# Patient Record
Sex: Female | Born: 1975 | Race: Black or African American | Hispanic: No | Marital: Single | Smoking: Current every day smoker
Health system: Southern US, Community
[De-identification: ages and names within clinical notes are randomized; demographics above are authoritative.]

## PROBLEM LIST (undated history)

## (undated) DIAGNOSIS — O009 Unspecified ectopic pregnancy without intrauterine pregnancy: Secondary | ICD-10-CM

## (undated) DIAGNOSIS — M1990 Unspecified osteoarthritis, unspecified site: Secondary | ICD-10-CM

## (undated) DIAGNOSIS — M069 Rheumatoid arthritis, unspecified: Secondary | ICD-10-CM

## (undated) HISTORY — DX: Rheumatoid arthritis, unspecified: M06.9

---

## 1997-07-29 ENCOUNTER — Inpatient Hospital Stay (HOSPITAL_COMMUNITY): Admission: AD | Admit: 1997-07-29 | Discharge: 1997-07-29 | Payer: Self-pay | Admitting: *Deleted

## 2000-08-30 ENCOUNTER — Emergency Department (HOSPITAL_COMMUNITY): Admission: EM | Admit: 2000-08-30 | Discharge: 2000-08-30 | Payer: Self-pay | Admitting: Emergency Medicine

## 2000-09-01 ENCOUNTER — Encounter (INDEPENDENT_AMBULATORY_CARE_PROVIDER_SITE_OTHER): Payer: Self-pay

## 2000-09-01 ENCOUNTER — Observation Stay (HOSPITAL_COMMUNITY): Admission: AD | Admit: 2000-09-01 | Discharge: 2000-09-02 | Payer: Self-pay | Admitting: Obstetrics

## 2000-09-01 ENCOUNTER — Encounter: Payer: Self-pay | Admitting: Obstetrics

## 2000-09-07 ENCOUNTER — Other Ambulatory Visit: Admission: RE | Admit: 2000-09-07 | Discharge: 2000-09-07 | Payer: Self-pay | Admitting: *Deleted

## 2001-09-08 ENCOUNTER — Inpatient Hospital Stay (HOSPITAL_COMMUNITY): Admission: AD | Admit: 2001-09-08 | Discharge: 2001-09-08 | Payer: Self-pay | Admitting: *Deleted

## 2001-09-30 ENCOUNTER — Inpatient Hospital Stay (HOSPITAL_COMMUNITY): Admission: AD | Admit: 2001-09-30 | Discharge: 2001-09-30 | Payer: Self-pay | Admitting: Obstetrics and Gynecology

## 2002-01-16 ENCOUNTER — Encounter: Payer: Self-pay | Admitting: Obstetrics & Gynecology

## 2002-01-16 ENCOUNTER — Inpatient Hospital Stay (HOSPITAL_COMMUNITY): Admission: AD | Admit: 2002-01-16 | Discharge: 2002-01-16 | Payer: Self-pay | Admitting: Obstetrics & Gynecology

## 2002-06-21 ENCOUNTER — Other Ambulatory Visit: Admission: RE | Admit: 2002-06-21 | Discharge: 2002-06-21 | Payer: Self-pay | Admitting: Obstetrics & Gynecology

## 2002-09-19 ENCOUNTER — Inpatient Hospital Stay (HOSPITAL_COMMUNITY): Admission: AD | Admit: 2002-09-19 | Discharge: 2002-09-22 | Payer: Self-pay | Admitting: Obstetrics & Gynecology

## 2007-11-22 ENCOUNTER — Inpatient Hospital Stay (HOSPITAL_COMMUNITY): Admission: AD | Admit: 2007-11-22 | Discharge: 2007-11-22 | Payer: Self-pay | Admitting: Family Medicine

## 2008-02-20 ENCOUNTER — Emergency Department (HOSPITAL_COMMUNITY): Admission: EM | Admit: 2008-02-20 | Discharge: 2008-02-20 | Payer: Self-pay | Admitting: Emergency Medicine

## 2009-09-17 ENCOUNTER — Inpatient Hospital Stay (HOSPITAL_COMMUNITY): Admission: AD | Admit: 2009-09-17 | Discharge: 2009-09-17 | Payer: Self-pay | Admitting: Obstetrics & Gynecology

## 2010-05-07 ENCOUNTER — Emergency Department (HOSPITAL_COMMUNITY)
Admission: EM | Admit: 2010-05-07 | Discharge: 2010-05-08 | Payer: Self-pay | Source: Home / Self Care | Admitting: Emergency Medicine

## 2010-05-07 LAB — CBC
MCH: 30.5 pg (ref 26.0–34.0)
MCHC: 33.8 g/dL (ref 30.0–36.0)
Platelets: 258 10*3/uL (ref 150–400)
RDW: 13.1 % (ref 11.5–15.5)

## 2010-05-07 LAB — DIFFERENTIAL
Basophils Absolute: 0 10*3/uL (ref 0.0–0.1)
Basophils Relative: 0 % (ref 0–1)
Eosinophils Absolute: 0.1 10*3/uL (ref 0.0–0.7)
Monocytes Absolute: 0.5 10*3/uL (ref 0.1–1.0)
Monocytes Relative: 8 % (ref 3–12)

## 2010-05-07 LAB — URINALYSIS, ROUTINE W REFLEX MICROSCOPIC
Bilirubin Urine: NEGATIVE
Ketones, ur: NEGATIVE mg/dL
Leukocytes, UA: NEGATIVE
Nitrite: NEGATIVE
Protein, ur: NEGATIVE mg/dL
Specific Gravity, Urine: 1.025 (ref 1.005–1.030)
Urine Glucose, Fasting: NEGATIVE mg/dL
Urobilinogen, UA: 1 mg/dL (ref 0.0–1.0)
pH: 7 (ref 5.0–8.0)

## 2010-05-07 LAB — WET PREP, GENITAL
Trich, Wet Prep: NONE SEEN
Yeast Wet Prep HPF POC: NONE SEEN

## 2010-05-07 LAB — POCT PREGNANCY, URINE: Preg Test, Ur: NEGATIVE

## 2010-05-07 LAB — URINE MICROSCOPIC-ADD ON

## 2010-05-08 LAB — COMPREHENSIVE METABOLIC PANEL
AST: 13 U/L (ref 0–37)
Albumin: 3.9 g/dL (ref 3.5–5.2)
Calcium: 9.6 mg/dL (ref 8.4–10.5)
Creatinine, Ser: 0.66 mg/dL (ref 0.4–1.2)
GFR calc Af Amer: >60 mL/min (ref >60)
GFR calc non Af Amer: >60 mL/min (ref >60)
Sodium: 138 mEq/L (ref 135–145)
Total Protein: 6.5 g/dL (ref 6.0–8.3)

## 2010-05-14 ENCOUNTER — Encounter: Payer: Self-pay | Admitting: Emergency Medicine

## 2010-06-23 ENCOUNTER — Emergency Department (HOSPITAL_COMMUNITY)
Admission: EM | Admit: 2010-06-23 | Discharge: 2010-06-23 | Disposition: A | Discharge status: Home / Self Care | Payer: Self-pay | Attending: Emergency Medicine | Admitting: Emergency Medicine

## 2010-06-23 DIAGNOSIS — R51 Headache: Secondary | ICD-10-CM | POA: Insufficient documentation

## 2010-06-23 DIAGNOSIS — IMO0002 Reserved for concepts with insufficient information to code with codable children: Secondary | ICD-10-CM | POA: Insufficient documentation

## 2010-06-23 DIAGNOSIS — S01501A Unspecified open wound of lip, initial encounter: Secondary | ICD-10-CM | POA: Insufficient documentation

## 2010-06-29 ENCOUNTER — Emergency Department (HOSPITAL_COMMUNITY)
Admission: EM | Admit: 2010-06-29 | Discharge: 2010-06-29 | Disposition: A | Discharge status: Home / Self Care | Payer: Self-pay | Attending: Emergency Medicine | Admitting: Emergency Medicine

## 2010-06-29 DIAGNOSIS — Z4802 Encounter for removal of sutures: Secondary | ICD-10-CM | POA: Insufficient documentation

## 2010-06-30 LAB — URINALYSIS, ROUTINE W REFLEX MICROSCOPIC
Bilirubin Urine: NEGATIVE
Leukocytes, UA: NEGATIVE
Nitrite: NEGATIVE
Specific Gravity, Urine: >1.03 — ABNORMAL HIGH (ref 1.005–1.030)
Urobilinogen, UA: 0.2 mg/dL (ref 0.0–1.0)

## 2010-06-30 LAB — CBC
HCT: 35.2 % — ABNORMAL LOW (ref 36.0–46.0)
Platelets: 294 10*3/uL (ref 150–400)
WBC: 5.8 10*3/uL (ref 4.0–10.5)

## 2010-06-30 LAB — POCT PREGNANCY, URINE: Preg Test, Ur: NEGATIVE

## 2010-06-30 LAB — WET PREP, GENITAL: Yeast Wet Prep HPF POC: NONE SEEN

## 2010-06-30 LAB — GC/CHLAMYDIA PROBE AMP, GENITAL
Chlamydia, DNA Probe: NEGATIVE
GC Probe Amp, Genital: NEGATIVE

## 2010-06-30 LAB — URINE MICROSCOPIC-ADD ON

## 2010-08-29 NOTE — Op Note (Signed)
[facility]  Patient:    Alison Lowery, Alison Lowery                        MRN: [id number] Proc. Date: 09/01/00 Adm. Date:  [id number] Attending:  Tammi Sou CC:         [facility], attn. Dr. Earline Mayotte                           Operative Report  PREOPERATIVE DIAGNOSIS:       Rule out left ectopic pregnancy.  POSTOPERATIVE DIAGNOSIS:      Left ampullary ectopic pregnancy, hemoperitoneum.  OPERATION:                    Diagnostic laparoscopy, left salpingectomy, and evacuation of hemoperitoneum.  SURGEON:                      Sung Amabile. Roslyn Smiling, M.D.  ASSISTANT:  ANESTHESIA:                   General anesthesia with endotracheal tube intubation.  ESTIMATED BLOOD LOSS:         200 cc.  TUBES AND DRAINS:             Foley intraoperatively.  COMPLICATIONS:                None.  FINDINGS:                     Anteverted, top normal size uterus with small subserosal fibroid in the posterior fundal area.  Left tube with ampullary pregnancy significantly distorted and distended, but unruptured. Approximately 150 cc of blood in the pelvis.  Normal left ovary.  Normal right tube and ovary, upper abdomen, and pelvis.  Appendix not visualized and felt to be retrocecal.  SPECIMENS:                    Left tube and tubal contents to pathology.  INDICATIONS:                  A 35 year old woman, gravida 3, para 1-0-1-1, last menstrual period July 15, 2000, who presented to the emergency room today because of significant left lower quadrant pain.  The patient is known to be pregnant.  She was evaluated in the [facility] ER on Aug 30, 2000, where a quantitative hCG was 267.  She was felt to have a threatened abortion, but advised to return to the hospital if no improvement.  She has had no dizziness or shoulder pain and quantitative hCG on May 22 was 264. Ultrasound reveals left-side adnexal mass and a moderate amount of free fluid in  the pelvis.  She is taken to the operating room now to rule out ectopic pregnancy.  DESCRIPTION OF PROCEDURE:     After the establishment of general anesthesia, the patient was placed in the dorsal lithotomy position.  The abdomen and perineum, and vagina were prepped with Betadine solution.  Foley catheter was placed.  Graves speculum was inserted in the vagina.  The cervix was prepped again with Betadine solution and the anterior cervical lip was grasped with a single tooth tenaculum.  Hulka tenaculum was passed into the uterus and attached to the cervix.  The speculum was removed and the patient was draped. A transverse subumbilical incision was made on  the skin with the knife. Veress needle was passed through this incision into the abdominal cavity while the lower abdominal wall was elevated.  Placement in the abdominal cavity was confirmed as the pressure dropped appropriately.  CO2 was insufflated.  After adequate pneumoperitoneum was instilled, Veress needle was removed, and laparoscopic sleeve and trocar were passed easily into the interabdominal cavity.  Scope was inserted and placement was ascertained.  Two other ports, one in the midline suprapubically and in the left lower quadrant were placed under direct visualization.  There were 5 mm ports.  The above findings were noted.  Because of the damage, distention, and distortion of the left tube, the decision was made to proceed with salpingectomy rather than salpingostomy.  The tube was grasped.  Tripolar cautery was used to cauterized and then transect the tube near the cornual area and then along the mesosalpinx.  The tube with its ectopic contents was then freed.  5 mm scope was placed in the left lower quadrant port and an endocatch bag was passed through the subumbilical port.  The tube and its contents were placed in the sac and then removed through the subumbilical port.  10/11 sleeve and trocar were reintroduced through  the subumbilical port.  The larger laparoscope was then reattached.  The pelvis was irrigated with normal saline through Nezhat irrigator.  Small bleeding areas along the serosa were cauterized with tripolar cautery.  Hemostasis was noted.  The upper abdomen was evaluated and noted to be normal.  The pelvis was liberally irrigated with warm normal saline.  The pneumoperitoneum was decreased and no bleeding from the surgical site was noted.  The lower sleeves were removed under direct visualization.  The remainder of the pneumoperitoneum was removed and the subumbilical port was then removed.  Marcaine (8 cc) was used to infiltrate the incisional areas.  Subumbilical incision fascia was closed with figure-of-eight suture of 0 Vicryl.  The fat was closed with single suture of 0 Vicryl.  All skin incisions were closed with mattress sutures of 4-0 Vicryl. Hemostasis was noted.  Steri-Strips were applied to all of the incisions. Pressure dressing was placed over the umbilical incision.  Vaginal instruments were removed and Foley catheter was removed.  The patient was returned to the supine position, extubated without difficulty, and transported to the recovery room in satisfactory condition. DD:  09/01/00 TD:  09/02/00 Job: [id number]

## 2010-08-29 NOTE — H&P (Signed)
NAME:  KENI, WAFER NO.:  [id number]   MEDICAL RECORD NO.:  [id number]                   PATIENT TYPE:  INP   LOCATION:  9169                                 FACILITY:  [facility]   PHYSICIAN:  Genia Del, M.D.             DATE OF BIRTH:  06-14-1975   DATE OF ADMISSION:  09/19/2002  DATE OF DISCHARGE:                                HISTORY & PHYSICAL   IDENTIFYING DATA:  Mrs.  Laguna is a 35 year old G5, P1, A3, expected date  of delivery September 15, 2002 at 40 weeks 4 days gestation.   REASON FOR ADMISSION:  Induction for postdates with suspicion of large for  gestational age and poor compliance to obstetrical care.   HISTORY OF PRESENT ILLNESS:  Fetal movements positive.  No regular uterine  contractions.  No vaginal bleeding.  No fluid leak.  No PIH symptoms.  The  patient did not come for return OB visits for the past four weeks. She came  today because she reached postdates and felt slightly decreased fetal  movements.  Vaginal exam reveals the cervix at 4+ cm dilated, vertex  presentation and membranes intact.   PAST OBSTETRICAL HISTORY:  In 1993 about nine weeks spontaneous abortion,  complete, no complications.  In 1994, 40-week spontaneous vaginal delivery,  baby boy, 7 pounds 7 ounces.  No complications.  May 2002 left ectopic  pregnancy and left salpingectomy at [facility].  No complications.  May  2003 seven week therapeutic abortion.  No complications.   PAST MEDICAL HISTORY:  Negative.   PAST SURGICAL HISTORY:  1. May 2002 ectopic pregnancy, left salpingectomy.  2. Therapeutic abortion in May 2003.   PAST GYNECOLOGICAL HISTORY:  Left salpingectomy in May 2002.   ALLERGIES:  No known drug allergies.   MEDICATIONS:  Prenatal vitamins.   SOCIAL HISTORY:  Single.  Lives with father of baby. Smoker.   HISTORY OF PRESENT PREGNANCY:  Late prenatal care. Nurse visit at [facility] at 22+ weeks, then a new Ob visit at 27+  weeks.  An ultrasound review  of anatomy was done at that time, which was within normal limits, dating  corresponded.  Placenta was anterior, normal.  Amniotic fluid was within  normal limits and the cervix was 3.8 cm and closed,  estimated fetal weight  was at the 58th percentile.  It was too late to do a triple test.  Her labs  at her first visit showed a hemoglobin at 10.8 and platelets 214,000.  Blood  type Rh, O positive.  Rh antibodies negative.  Sickle cell trait negative.  Thalassemia negative.  RPR nonreactive.  HBSAG negative.  HIV nonreactive.  Rubella titers immune.  One-hour GTT was within normal limits.  The patient  had a group B Strep done at 35+ weeks, which was negative.  Blood pressures  were normal each visit and uterine height corresponded well, but patient's  frequency; did not present for follow up.  She was not seen for the past  four weeks until her visit today on June 08th at 40 weeks 4 days.  An  ultrasound was done at the office, which showed an estimated fetal weight  just above 9 pounds with normal amniotic fluid and cephalic presentation.   REVIEW OF SYSTEMS:  CONSTITUTIONAL:  Negative.  HEENT:  Negative.  RESPIRATORY AND CARDIOVASCULAR:  Negative.  GASTROINTESTINAL AND UROLOGIC:  Negative.  ENDOCRINOLOGIC, NEUROLOGIC AND DERMATOLOGIC:  Negative.   PHYSICAL EXAMINATION:  GENERAL:  No apparent distress.  VITAL SIGNS:  Blood pressure 123/70, pulse 96 and regular, respiratory rate  20 and temperature 98.2.  LUNGS:  Clear.  HEART:  Regular cardiac rhythm.  ABDOMEN:  Gravid.  Cephalic presentation.  VAGINAL EXAMINATION:  Four plus centimeters dilated, 80%, vertex, -1 and  membranes intact.  EXTREMITIES:  Lower limbs normal.   Fetal monitoring:  Fetal heart rate 130s, variable with accelerations and no  decelerations.  Uterine contractions every six to eight minutes lasting 60  seconds to 80 seconds, mild.   IMPRESSION:  1. Gravida 5, para 1, abortus 3 at  40 weeks 4 days gestation with postdates     and suspicion of large for gestational age, and poor compliance to     obstetrical care.  2. Fetal well-being reassuring.  3. Group B Streptococcus negative.   PLAN:  1. Admit to labor and delivery.  2. Monitoring.  3. Induction with low-dose Pitocin and artificial rupture of membranes.  4. Expectant management with probable vaginal delivery.                                                 Genia Del, M.D.    ML/MEDQ  D:  09/19/2002  T:  09/20/2002  Job:  [id number]

## 2010-09-06 ENCOUNTER — Emergency Department (HOSPITAL_COMMUNITY)
Admission: EM | Admit: 2010-09-06 | Discharge: 2010-09-06 | Disposition: A | Discharge status: Home / Self Care | Payer: Self-pay | Attending: Emergency Medicine | Admitting: Emergency Medicine

## 2010-09-06 DIAGNOSIS — K089 Disorder of teeth and supporting structures, unspecified: Secondary | ICD-10-CM | POA: Insufficient documentation

## 2010-09-06 DIAGNOSIS — K047 Periapical abscess without sinus: Secondary | ICD-10-CM | POA: Insufficient documentation

## 2010-10-19 ENCOUNTER — Inpatient Hospital Stay (HOSPITAL_COMMUNITY)
Admission: AD | Admit: 2010-10-19 | Discharge: 2010-10-19 | Disposition: A | Discharge status: Home / Self Care | Payer: Self-pay | Source: Ambulatory Visit | Attending: Obstetrics & Gynecology | Admitting: Obstetrics & Gynecology

## 2010-10-19 ENCOUNTER — Inpatient Hospital Stay (HOSPITAL_COMMUNITY): Payer: Self-pay | Admitting: Obstetrics & Gynecology

## 2010-10-19 DIAGNOSIS — N63 Unspecified lump in unspecified breast: Secondary | ICD-10-CM | POA: Insufficient documentation

## 2010-10-19 DIAGNOSIS — N644 Mastodynia: Secondary | ICD-10-CM | POA: Insufficient documentation

## 2010-10-19 DIAGNOSIS — N6310 Unspecified lump in the right breast, unspecified quadrant: Secondary | ICD-10-CM

## 2010-10-19 NOTE — Progress Notes (Signed)
Lump began a week ago with pulling sensation.  No change in size over the past week.  No pain noted.  Lump on outer portion of right breast.

## 2010-10-19 NOTE — ED Provider Notes (Signed)
History   35 yo AA female presents with c/o breast lump. Pt felt a strain under her R arm 1-2 wks ago, noticed a lump in her R breast. It is walnut sized, not tender, no nipple discharge, no skin change. No fever/chills. LMP 09/27/10, no hormonal medications.  No breast cancer hx. Chief Complaint  Patient presents with  . Breast Pain   The history is provided by the patient.    Pertinent Gynecological History: Menses: regular every 28 days without intermenstrual spotting Bleeding: none Contraception: none DES exposure: unknown Blood transfusions: none Sexually transmitted diseases: no past history Previous GYN Procedures: colpo  Last mammogram: never  had Last pap: normal Date: 2008   No past medical history on file.  No past surgical history on file.  No family history on file.  History  Substance Use Topics  . Smoking status: Not on file  . Smokeless tobacco: Not on file  . Alcohol Use: Not on file    Allergies: No Known Allergies  No prescriptions prior to admission    Review of Systems  Constitutional: Negative for fever and chills.  Genitourinary:       Non tender lump R breast  Skin: Negative.    Physical Exam   Blood pressure 106/70, pulse 81, temperature 98.5 F (36.9 C), temperature source Oral, resp. rate 16, height 5' 7.25" (1.708 m), weight 129 lb (58.514 kg), last menstrual period 09/27/2010.  Physical Exam  Constitutional: She appears well-developed and well-nourished.  Respiratory: Right breast exhibits mass. Right breast exhibits no inverted nipple, no nipple discharge, no skin change and no tenderness. Left breast exhibits no inverted nipple, no mass, no nipple discharge, no skin change and no tenderness. Breasts are symmetrical.      MAU Course  Procedures  MDM [facility], pt will be notified.   Avon Gully. Toribio Seiber 10/19/10 1224

## 2010-10-19 NOTE — Progress Notes (Signed)
Patient reports first noticed a lump in right breast a couple of weeks ago not painful however feels a pulling sensation

## 2010-10-21 ENCOUNTER — Other Ambulatory Visit: Payer: Self-pay | Admitting: Family Medicine

## 2010-10-21 ENCOUNTER — Encounter: Payer: Self-pay | Admitting: Advanced Practice Midwife

## 2010-10-21 DIAGNOSIS — N6310 Unspecified lump in the right breast, unspecified quadrant: Secondary | ICD-10-CM

## 2010-10-24 ENCOUNTER — Ambulatory Visit
Admission: RE | Admit: 2010-10-24 | Discharge: 2010-10-24 | Disposition: A | Discharge status: Home / Self Care | Payer: Self-pay | Source: Ambulatory Visit | Attending: Family Medicine | Admitting: Family Medicine

## 2010-10-24 ENCOUNTER — Other Ambulatory Visit: Payer: Self-pay | Admitting: Family Medicine

## 2010-10-24 DIAGNOSIS — N6310 Unspecified lump in the right breast, unspecified quadrant: Secondary | ICD-10-CM

## 2010-10-27 ENCOUNTER — Other Ambulatory Visit: Payer: Self-pay

## 2010-10-31 ENCOUNTER — Other Ambulatory Visit: Payer: Self-pay

## 2010-11-07 ENCOUNTER — Inpatient Hospital Stay (HOSPITAL_COMMUNITY)
Admission: AD | Admit: 2010-11-07 | Discharge: 2010-11-07 | Disposition: A | Discharge status: Home / Self Care | Payer: Self-pay | Source: Ambulatory Visit | Attending: Obstetrics & Gynecology | Admitting: Obstetrics & Gynecology

## 2010-11-28 ENCOUNTER — Ambulatory Visit
Admission: RE | Admit: 2010-11-28 | Discharge: 2010-11-28 | Disposition: A | Discharge status: Home / Self Care | Payer: No Typology Code available for payment source | Source: Ambulatory Visit | Attending: Family Medicine | Admitting: Family Medicine

## 2011-01-09 LAB — URINALYSIS, ROUTINE W REFLEX MICROSCOPIC
Ketones, ur: NEGATIVE
Leukocytes, UA: NEGATIVE
Nitrite: NEGATIVE
Protein, ur: NEGATIVE

## 2011-01-09 LAB — WET PREP, GENITAL
Trich, Wet Prep: NONE SEEN
Yeast Wet Prep HPF POC: NONE SEEN

## 2011-01-09 LAB — POCT PREGNANCY, URINE: Preg Test, Ur: NEGATIVE

## 2011-01-09 LAB — GC/CHLAMYDIA PROBE AMP, GENITAL: Chlamydia, DNA Probe: NEGATIVE

## 2011-01-13 LAB — RAPID URINE DRUG SCREEN, HOSP PERFORMED: Barbiturates: NOT DETECTED

## 2011-01-13 LAB — PREGNANCY, URINE: Preg Test, Ur: NEGATIVE

## 2013-02-21 ENCOUNTER — Encounter (HOSPITAL_COMMUNITY): Payer: Self-pay | Admitting: Emergency Medicine

## 2013-02-21 ENCOUNTER — Emergency Department (HOSPITAL_COMMUNITY)
Admission: EM | Admit: 2013-02-21 | Discharge: 2013-02-22 | Disposition: A | Discharge status: Home / Self Care | Payer: BC Managed Care – PPO | Attending: Emergency Medicine | Admitting: Emergency Medicine

## 2013-02-21 DIAGNOSIS — R109 Unspecified abdominal pain: Secondary | ICD-10-CM

## 2013-02-21 DIAGNOSIS — R319 Hematuria, unspecified: Secondary | ICD-10-CM | POA: Insufficient documentation

## 2013-02-21 DIAGNOSIS — R1031 Right lower quadrant pain: Secondary | ICD-10-CM | POA: Insufficient documentation

## 2013-02-21 DIAGNOSIS — N898 Other specified noninflammatory disorders of vagina: Secondary | ICD-10-CM | POA: Insufficient documentation

## 2013-02-21 DIAGNOSIS — R195 Other fecal abnormalities: Secondary | ICD-10-CM | POA: Insufficient documentation

## 2013-02-21 DIAGNOSIS — F17200 Nicotine dependence, unspecified, uncomplicated: Secondary | ICD-10-CM | POA: Insufficient documentation

## 2013-02-21 DIAGNOSIS — R1012 Left upper quadrant pain: Secondary | ICD-10-CM | POA: Insufficient documentation

## 2013-02-21 LAB — WET PREP, GENITAL
Trich, Wet Prep: NONE SEEN
Yeast Wet Prep HPF POC: NONE SEEN

## 2013-02-21 LAB — CBC WITH DIFFERENTIAL/PLATELET
Eosinophils Relative: 1 % (ref 0–5)
HCT: 36 % (ref 36.0–46.0)
Lymphocytes Relative: 40 % (ref 12–46)
Lymphs Abs: 2.4 10*3/uL (ref 0.7–4.0)
MCV: 92.1 fL (ref 78.0–100.0)
Monocytes Absolute: 0.4 10*3/uL (ref 0.1–1.0)
Monocytes Relative: 7 % (ref 3–12)
RBC: 3.91 MIL/uL (ref 3.87–5.11)
WBC: 6.1 10*3/uL (ref 4.0–10.5)

## 2013-02-21 LAB — COMPREHENSIVE METABOLIC PANEL
ALT: 7 U/L (ref 0–35)
BUN: 8 mg/dL (ref 6–23)
CO2: 26 mEq/L (ref 19–32)
Calcium: 9.2 mg/dL (ref 8.4–10.5)
Creatinine, Ser: 0.72 mg/dL (ref 0.50–1.10)
GFR calc Af Amer: >90 mL/min (ref >90)
GFR calc non Af Amer: >90 mL/min (ref >90)
Glucose, Bld: 88 mg/dL (ref 70–99)

## 2013-02-21 LAB — URINALYSIS, ROUTINE W REFLEX MICROSCOPIC
Bilirubin Urine: NEGATIVE
Nitrite: NEGATIVE
Specific Gravity, Urine: 1.028 (ref 1.005–1.030)
Urobilinogen, UA: 1 mg/dL (ref 0.0–1.0)

## 2013-02-21 LAB — URINE MICROSCOPIC-ADD ON

## 2013-02-21 NOTE — ED Notes (Signed)
Pt c/o lower abd pain and left flank pain x 2 months with some changes in bowel

## 2013-02-21 NOTE — ED Notes (Signed)
Pt alert, NAD, calm, interactive, resps e/u, speaking in clear complete sentences, (denies: pain, sob, nausea dizziness or other sx), EDPA Palmer in to see pt, pelvic cart set up at [facility].

## 2013-02-21 NOTE — ED Provider Notes (Signed)
CSN: [id number]     Arrival date & time 02/21/13  1731 History   First MD Initiated Contact with Patient 02/21/13 2230     Chief Complaint  Patient presents with  . Abdominal Pain    HPI  Alison Lowery is a 37 y.o. female with no PMH who presents to the ED for evaluation of abdominal pain.  History was provided by patient.  Patient states that she has had intermittent left upper quadrant abdominal pain for the past 2 months. She describes her abdominal pain as a "inflammation" pain.  When asked to describe this she states that it is a "movement" type of feeling. She states she's felt very bloated and been having lots of flatulence. She also has had right lower quadrant abdominal pain for the past 2 weeks.  She states that her right lower quadrant pain is more of an intermittent aching sensation.  She states that she has had "jelly" stools with no hematochezia.  No diarrhea or constipation.   She denies any abdominal pain currently. No nausea, no vomiting, and no change in appetite. She states she's had some white thick vaginal discharge, which is not normal for her. She has not had recent STI testing. She is currently sexually active. She states that she had one of her ovaries removed in the past but cannot remember which side. Her last normal menstrual period was the first of the month. She denies any sick contacts. No history of kidney stones. No dysuria however patient states she has had increased frequency of urination.  She denies any fevers, rhinorrhea, chest pain, cough, shortness of breath, headache, dizziness, or lightheadedness.   History reviewed. No pertinent past medical history. History reviewed. No pertinent past surgical history. History reviewed. No pertinent family history. History  Substance Use Topics  . Smoking status: Current Every Day Smoker  . Smokeless tobacco: Not on file  . Alcohol Use: Yes     Comment: occ   OB History   Grav Para Term Preterm Abortions TAB SAB Ect  Mult Living                 Review of Systems  Constitutional: Negative for fever, chills, diaphoresis, activity change, appetite change and fatigue.  HENT: Negative for congestion and rhinorrhea.   Eyes: Negative for visual disturbance.  Respiratory: Negative for cough and shortness of breath.   Cardiovascular: Negative for chest pain and leg swelling.  Gastrointestinal: Positive for abdominal pain and abdominal distention. Negative for nausea, vomiting, diarrhea, constipation, blood in stool, anal bleeding and rectal pain.  Genitourinary: Positive for frequency and vaginal discharge. Negative for dysuria, urgency, hematuria, decreased urine volume, vaginal bleeding, difficulty urinating and vaginal pain.  Musculoskeletal: Negative for back pain and myalgias.  Skin: Negative for color change and wound.  Neurological: Negative for dizziness, weakness, light-headedness, numbness and headaches.    Allergies  Review of patient's allergies indicates no known allergies.  Home Medications  No current outpatient prescriptions on file. BP 120/61  Pulse 67  Temp(Src) 98.2 F (36.8 C) (Oral)  Resp 16  Ht 5\' 8"  (1.727 m)  Wt 163 lb 3.2 oz (74.027 kg)  BMI 24.82 kg/m2  SpO2 99%  Filed Vitals:   02/21/13 2203 02/21/13 2230 02/21/13 2300 02/21/13 2330  BP:  108/66 106/65 120/74  Pulse: 67 68 68 74  Temp:      TempSrc:      Resp:      Height:      Weight:  SpO2: 99% 100% 99% 98%     Physical Exam  Nursing note and vitals reviewed. Constitutional: She is oriented to person, place, and time. She appears well-developed and well-nourished. No distress.  HENT:  Head: Normocephalic and atraumatic.  Right Ear: External ear normal.  Left Ear: External ear normal.  Nose: Nose normal.  Mouth/Throat: Oropharynx is clear and moist. No oropharyngeal exudate.  Eyes: Conjunctivae are normal. Right eye exhibits no discharge. Left eye exhibits no discharge.  Neck: Normal range of motion.  Neck supple.  Cardiovascular: Normal rate, regular rhythm, normal heart sounds and intact distal pulses.  Exam reveals no gallop and no friction rub.   No murmur heard. Pulmonary/Chest: Effort normal and breath sounds normal. No respiratory distress. She has no wheezes. She has no rales. She exhibits no tenderness.  Abdominal: Soft. Bowel sounds are normal. She exhibits no distension and no mass. There is no tenderness. There is no rebound and no guarding.  Musculoskeletal: Normal range of motion. She exhibits no edema and no tenderness.  No CVA, flank, or lumbar tenderness to palpation. No leg edema bilaterally  Neurological: She is alert and oriented to person, place, and time.  Skin: Skin is warm and dry. She is not diaphoretic.    ED Course  Procedures (including critical care time) Labs Review Labs Reviewed  COMPREHENSIVE METABOLIC PANEL - Abnormal; Notable for the following:    Alkaline Phosphatase 34 (*)    Total Bilirubin 0.1 (*)    All other components within normal limits  URINALYSIS, ROUTINE W REFLEX MICROSCOPIC - Abnormal; Notable for the following:    Hgb urine dipstick SMALL (*)    All other components within normal limits  CBC WITH DIFFERENTIAL  URINE MICROSCOPIC-ADD ON  POCT PREGNANCY, URINE   Imaging Review No results found.  EKG Interpretation   None      Results for orders placed during the hospital encounter of 02/21/13  WET PREP, GENITAL      Result Value Range   Yeast Wet Prep HPF POC NONE SEEN  NONE SEEN   Trich, Wet Prep NONE SEEN  NONE SEEN   Clue Cells Wet Prep HPF POC FEW (*) NONE SEEN   WBC, Wet Prep HPF POC FEW (*) NONE SEEN  CBC WITH DIFFERENTIAL      Result Value Range   WBC 6.1  4.0 - 10.5 K/uL   RBC 3.91  3.87 - 5.11 MIL/uL   Hemoglobin 12.4  12.0 - 15.0 g/dL   HCT 40.9  81.1 - 91.4 %   MCV 92.1  78.0 - 100.0 fL   MCH 31.7  26.0 - 34.0 pg   MCHC 34.4  30.0 - 36.0 g/dL   RDW 78.2  95.6 - 21.3 %   Platelets 297  150 - 400 K/uL    Neutrophils Relative % 52  43 - 77 %   Neutro Abs 3.2  1.7 - 7.7 K/uL   Lymphocytes Relative 40  12 - 46 %   Lymphs Abs 2.4  0.7 - 4.0 K/uL   Monocytes Relative 7  3 - 12 %   Monocytes Absolute 0.4  0.1 - 1.0 K/uL   Eosinophils Relative 1  0 - 5 %   Eosinophils Absolute 0.1  0.0 - 0.7 K/uL   Basophils Relative 0  0 - 1 %   Basophils Absolute 0.0  0.0 - 0.1 K/uL  COMPREHENSIVE METABOLIC PANEL      Result Value Range   Sodium 137  135 -  145 mEq/L   Potassium 3.8  3.5 - 5.1 mEq/L   Chloride 104  96 - 112 mEq/L   CO2 26  19 - 32 mEq/L   Glucose, Bld 88  70 - 99 mg/dL   BUN 8  6 - 23 mg/dL   Creatinine, Ser 6.96  0.50 - 1.10 mg/dL   Calcium 9.2  8.4 - 29.5 mg/dL   Total Protein 7.0  6.0 - 8.3 g/dL   Albumin 3.8  3.5 - 5.2 g/dL   AST 12  0 - 37 U/L   ALT 7  0 - 35 U/L   Alkaline Phosphatase 34 (*) 39 - 117 U/L   Total Bilirubin 0.1 (*) 0.3 - 1.2 mg/dL   GFR calc non Af Amer >90  >90 mL/min   GFR calc Af Amer >90  >90 mL/min  URINALYSIS, ROUTINE W REFLEX MICROSCOPIC      Result Value Range   Color, Urine YELLOW  YELLOW   APPearance CLEAR  CLEAR   Specific Gravity, Urine 1.028  1.005 - 1.030   pH 7.0  5.0 - 8.0   Glucose, UA NEGATIVE  NEGATIVE mg/dL   Hgb urine dipstick SMALL (*) NEGATIVE   Bilirubin Urine NEGATIVE  NEGATIVE   Ketones, ur NEGATIVE  NEGATIVE mg/dL   Protein, ur NEGATIVE  NEGATIVE mg/dL   Urobilinogen, UA 1.0  0.0 - 1.0 mg/dL   Nitrite NEGATIVE  NEGATIVE   Leukocytes, UA NEGATIVE  NEGATIVE  URINE MICROSCOPIC-ADD ON      Result Value Range   Squamous Epithelial / LPF RARE  RARE   RBC / HPF 7-10  <3 RBC/hpf   Bacteria, UA RARE  RARE   Urine-Other MUCOUS PRESENT    POCT PREGNANCY, URINE      Result Value Range   Preg Test, Ur NEGATIVE  NEGATIVE  OCCULT BLOOD, POC DEVICE      Result Value Range   Fecal Occult Bld POSITIVE (*) NEGATIVE    MDM   Bless Lisenby is a 37 y.o. female with no PMH who presents to the ED for evaluation of abdominal pain.` CBC, CMP,  UA, urine pregnancy, occult blood stool ordered to further evaluate.   Rechecks  11:42 PM = pelvic exam performed at bedside with ED staff Alison Lowery present.  Minimal amount of thin white discharge present in the vaginal vault.  No blood. No cervical motion tenderness or adnexal tenderness bilaterally. Wet mount sent to lab. GC probe sent. Rectal exam performed. No external anal fissures or hemorrhoids. Minimal amount of soft brown stool present in the rectal vault. Occult stool sent to lab for testing.  12:00 AM = spoke with patient regarding results. Patient comfortable and denies any pain.    Etiology of abdominal pain is unclear at this time. Patient has had abdominal pain for the past few months with no acute changes. Her abdominal exam was benign. She had no complaints of abdominal pain throughout her ED visit. Her labs were otherwise unremarkable. She was found to have a positive occult stool.  Her H&H is stable. Vital signs stable.  Patient was given referral to GI instructed to make an appointment as soon as possible. Patient was also found to have a small amount of blood in her urine. She was instructed to follow up on this as well. Patient was found to have a few clue cells on her wet mount. She is asymptomatic and will not be treated for BV at this time. She is instructed to  followup with women's health regarding this. Patient was in agreement with discharge and plan. Return precautions were given.    Final impressions: 1. Abdominal pain   2. Occult blood positive stool   3. Hematuria      Luiz Iron PA-C   This patient was discussed with Dr. Lenoard Aden, PA-C 02/22/13 0401

## 2013-02-23 LAB — GC/CHLAMYDIA PROBE AMP: CT Probe RNA: NEGATIVE

## 2013-02-23 NOTE — ED Provider Notes (Signed)
Medical screening examination/treatment/procedure(s) were conducted as a shared visit with non-physician practitioner(s) or resident and myself. I personally evaluated the patient during the encounter and agree with the findings and plan unless otherwise indicated.  I have personally reviewed any xrays and/ or EKG's with the provider and I agree with interpretation.  Intermittent left flank pain and LUQ pain for 2 months.  Hemocult pos in ED.  No active bleeding or gross blood. Labs unremarkable, exam benign.  Mild left UQ tenderness, no guarding or rigidity, MMM.  Discussed close outpt fup with pcp/ GI.   Abdominal pain,hemocult pos stools, hematuria   Enid Skeens, MD 02/23/13 [phone]

## 2014-06-29 ENCOUNTER — Other Ambulatory Visit: Payer: Self-pay | Admitting: Family Medicine

## 2014-06-29 ENCOUNTER — Other Ambulatory Visit (HOSPITAL_COMMUNITY)
Admission: RE | Admit: 2014-06-29 | Discharge: 2014-06-29 | Disposition: A | Discharge status: Home / Self Care | Payer: BLUE CROSS/BLUE SHIELD | Source: Ambulatory Visit | Attending: Family Medicine | Admitting: Family Medicine

## 2014-06-29 DIAGNOSIS — Z124 Encounter for screening for malignant neoplasm of cervix: Secondary | ICD-10-CM | POA: Diagnosis present

## 2014-07-03 LAB — CYTOLOGY - PAP

## 2016-06-09 ENCOUNTER — Encounter (HOSPITAL_COMMUNITY): Payer: Self-pay | Admitting: Emergency Medicine

## 2016-06-09 ENCOUNTER — Emergency Department (HOSPITAL_COMMUNITY)
Admission: EM | Admit: 2016-06-09 | Discharge: 2016-06-10 | Disposition: A | Discharge status: Home / Self Care | Payer: BLUE CROSS/BLUE SHIELD | Attending: Emergency Medicine | Admitting: Emergency Medicine

## 2016-06-09 ENCOUNTER — Emergency Department (HOSPITAL_COMMUNITY): Payer: BLUE CROSS/BLUE SHIELD

## 2016-06-09 DIAGNOSIS — Z5321 Procedure and treatment not carried out due to patient leaving prior to being seen by health care provider: Secondary | ICD-10-CM | POA: Insufficient documentation

## 2016-06-09 DIAGNOSIS — R0789 Other chest pain: Secondary | ICD-10-CM | POA: Insufficient documentation

## 2016-06-09 HISTORY — DX: Unspecified ectopic pregnancy without intrauterine pregnancy: O00.90

## 2016-06-09 LAB — CBC
HEMATOCRIT: 31.4 % — AB (ref 36.0–46.0)
HEMOGLOBIN: 9.8 g/dL — AB (ref 12.0–15.0)
MCH: 25.9 pg — ABNORMAL LOW (ref 26.0–34.0)
MCHC: 31.2 g/dL (ref 30.0–36.0)
MCV: 82.8 fL (ref 78.0–100.0)
Platelets: 314 10*3/uL (ref 150–400)
RBC: 3.79 MIL/uL — AB (ref 3.87–5.11)
RDW: 16.2 % — ABNORMAL HIGH (ref 11.5–15.5)
WBC: 5.1 10*3/uL (ref 4.0–10.5)

## 2016-06-09 LAB — BASIC METABOLIC PANEL
Anion gap: 5 (ref 5–15)
BUN: 6 mg/dL (ref 6–20)
CHLORIDE: 107 mmol/L (ref 101–111)
CO2: 25 mmol/L (ref 22–32)
Calcium: 9.1 mg/dL (ref 8.9–10.3)
Creatinine, Ser: 0.67 mg/dL (ref 0.44–1.00)
GFR calc Af Amer: >60 mL/min (ref >60)
GFR calc non Af Amer: >60 mL/min (ref >60)
Glucose, Bld: 85 mg/dL (ref 65–99)
POTASSIUM: 3.6 mmol/L (ref 3.5–5.1)
SODIUM: 137 mmol/L (ref 135–145)

## 2016-06-09 LAB — I-STAT BETA HCG BLOOD, ED (MC, WL, AP ONLY)

## 2016-06-09 LAB — I-STAT TROPONIN, ED: Troponin i, poc: 0 ng/mL (ref 0.00–0.08)

## 2016-06-09 LAB — D-DIMER, QUANTITATIVE (NOT AT ARMC): D DIMER QUANT: 3.35 ug{FEU}/mL — AB (ref 0.00–0.50)

## 2016-06-09 NOTE — ED Triage Notes (Signed)
Pt states she is having  Left chest pressure that makes her been SOB and hurt even if she moves her neck. Denies any injury, fever or chillls. States she had an appointment to see  Cardiology tomorrow but she can stand the pain.

## 2016-06-10 NOTE — ED Notes (Signed)
This RN attempted to call patient on mobile and home numbers after no response for rooming.  Home number goes straight to voicemail with no identifying information, and the other number is incorrect.  Will follow up with Charge RN.

## 2016-11-18 DIAGNOSIS — Z862 Personal history of diseases of the blood and blood-forming organs and certain disorders involving the immune mechanism: Secondary | ICD-10-CM | POA: Insufficient documentation

## 2016-11-18 DIAGNOSIS — Z8679 Personal history of other diseases of the circulatory system: Secondary | ICD-10-CM | POA: Insufficient documentation

## 2016-11-18 DIAGNOSIS — Z720 Tobacco use: Secondary | ICD-10-CM | POA: Insufficient documentation

## 2016-11-18 DIAGNOSIS — Z87898 Personal history of other specified conditions: Secondary | ICD-10-CM | POA: Insufficient documentation

## 2016-11-18 DIAGNOSIS — R768 Other specified abnormal immunological findings in serum: Secondary | ICD-10-CM | POA: Insufficient documentation

## 2016-11-18 NOTE — Progress Notes (Signed)
Office Visit Note  Patient: Alison Lowery             Date of Birth: 1976/01/26           MRN: [id number]             PCP: [facility] [place] Referring: Milford Cage, PA Visit Date: 11/25/2016 Occupation: Customer service for [employer]    Subjective:  Pain in multiple joints.   History of Present Illness: Alison Lowery is a 41 y.o. female seen in consultation per request of her PCP according to patient her symptoms a started about 4-5 months ago. She states she was using a car and did not have power steering she started noticing some discomfort in her hands. Over time she started having discomfort in her bilateral feet. She states she's tried different shoes but her toes under the ball of her foot continues to hurt. She has noticed mild swelling in her left foot. She's also had discomfort in her left knee joint with an episode of swelling in the past. She states her joints pop all the time.  Activities of Daily Living:  Patient reports morning stiffness for 30 minutes.   Patient Denies nocturnal pain.  Difficulty dressing/grooming: Denies Difficulty climbing stairs: Reports Difficulty getting out of chair: Reports Difficulty using hands for taps, buttons, cutlery, and/or writing: Denies   Review of Systems  Constitutional: Negative for fatigue, night sweats, weight gain, weight loss and weakness.  HENT: Negative for mouth sores, trouble swallowing, trouble swallowing, mouth dryness and nose dryness.   Eyes: Negative for pain, redness, visual disturbance and dryness.  Respiratory: Negative for cough, shortness of breath and difficulty breathing.   Cardiovascular: Negative for chest pain, palpitations, hypertension, irregular heartbeat and swelling in legs/feet.  Gastrointestinal: Negative for blood in stool, constipation and diarrhea.  Endocrine: Negative for increased urination.  Genitourinary: Negative for vaginal dryness.  Musculoskeletal: Positive for arthralgias, joint  pain and morning stiffness. Negative for joint swelling, myalgias, muscle weakness, muscle tenderness and myalgias.  Skin: Negative for color change, rash, hair loss, skin tightness, ulcers and sensitivity to sunlight.  Allergic/Immunologic: Negative for susceptible to infections.  Neurological: Negative for dizziness, memory loss and night sweats.  Hematological: Negative for swollen glands.  Psychiatric/Behavioral: Negative for depressed mood and sleep disturbance. The patient is not nervous/anxious.     PMFS History:  Patient Active Problem List   Diagnosis Date Noted  . Elevated rheumatoid factor 11/18/2016  . Tobacco abuse 11/18/2016  . History of abnormal electrocardiogram 11/18/2016  . History of anemia 11/18/2016  . History of diarrhea 11/18/2016  . Breast mass, right 10/21/2010    Past Medical History:  Diagnosis Date  . Ectopic pregnancy     History reviewed. No pertinent family history. History reviewed. No pertinent surgical history. Social History   Social History Narrative  . No narrative on file     Objective: Vital Signs: BP 117/72 (BP Location: Left Arm, Patient Position: Sitting, Cuff Size: Normal)   Pulse 76   Ht 5\' 7"  (1.702 m)   Wt 153 lb (69.4 kg)   BMI 23.96 kg/m    Physical Exam  Constitutional: She is oriented to person, place, and time. She appears well-developed and well-nourished.  HENT:  Head: Normocephalic and atraumatic.  Eyes: Conjunctivae and EOM are normal.  Neck: Normal range of motion.  Cardiovascular: Normal rate, regular rhythm, normal heart sounds and intact distal pulses.   Pulmonary/Chest: Effort normal and breath sounds normal.  Abdominal: Soft.  Bowel sounds are normal.  Lymphadenopathy:    She has no cervical adenopathy.  Neurological: She is alert and oriented to person, place, and time.  Skin: Skin is warm and dry. Capillary refill takes less than 2 seconds.  Psychiatric: She has a normal mood and affect. Her behavior is  normal.  Nursing note and vitals reviewed.    Musculoskeletal Exam: C-spine and thoracic lumbar spine good range of motion. Shoulder joints elbow joints wrist joint MCPs PIPs DIPs with good range of motion. She is some tenderness over right third PIP joint. Hip joints knee joints ankles MTPs PIPs DIPs with good range of motion. She has some tenderness across her MTP joints but no synovitis was noted. No warmth swelling or effusion in the left knee was noted.  CDAI Exam: No CDAI exam completed.    Investigation: Findings:  05/25/2016 RF elevated 151, Uric Acid 1.9  07/17/2016 CMP normal, CBC hemoglobin 9.6 hematocrit 33.1 otherwise normal,     Imaging: Xr Foot 2 Views Left  Result Date: 11/25/2016 First MTP narrowing and valgus deformity. All other MTPs, PIPs DIPs were within normal limits. No joint space narrowing or erosive changes were noted. Impression: Mild osteoarthritis of the foot  Xr Foot 2 Views Right  Result Date: 11/25/2016 First MTP narrowing and valgus deformity. All other MTPs, PIPs DIPs were within normal limits. No joint space narrowing or erosive changes were noted. Impression: Mild osteoarthritis of the foot  Xr Hand 2 View Left  Result Date: 11/25/2016 No MCP PIP/DIP narrowing was noted. No intercarpal joint space narrowing was noted. No erosive changes were noted. Impression normal x-ray of the hand  Xr Hand 2 View Right  Result Date: 11/25/2016 No MCP PIP/DIP narrowing was noted. No intercarpal joint space narrowing was noted. No erosive changes were noted. Impression normal x-ray of the hand  Xr Knee 3 View Left  Result Date: 11/25/2016 No joint space narrowing was noted. No patellofemoral narrowing or chondrocalcinosis was noted. Impression: Normal x-ray of the knee joint.   Speciality Comments: No specialty comments available.    Procedures:  No procedures performed Allergies: Patient has no known allergies.   Assessment / Plan:     Visit  Diagnoses: Pain in both hands -she complains of ongoing pain and discomfort in her bilateral hands. I do not see any synovitis on examination today. We will obtain following x-rays in the labs today. Plan: XR Hand 2 View Right, XR Hand 2 View Left,. The x-rays were within normal limits. Sedimentation rate, Cyclic citrul peptide antibody, IgG, 14-3-3 eta Protein, ANA  Acute pain of left knee. She complains of knee joint pain and intermittent swelling her do not see any warmth swelling or effusion on examination today. - Plan: XR KNEE 3 VIEW LEFT. The x-ray was within normal limits.  Pain in both feet. She is ongoing pain in her bilateral feet. She describes pain and discomfort in her bilateral MTPs. - Plan: XR Foot 2 Views Right, XR Foot 2 Views. X-rays were within normal limits. Except for bilateral first MTP arthritis. Left. Plan ultrasound of bilateral feet to rule out synovitis.  Elevated rheumatoid factor: She is significantly high rheumatoid factor.  Anemia, unspecified type - Plan: CBC with Differential/Platelet, Glucose 6 phosphate dehydrogenase  I will also screen her for hepatitis as it can cause positive rheumatoid factor. History of diarrhea  History of anemia: Anemia of unknown etiology  Tobacco abuse : Smoking cessation was discussed. Association of his smoking and rheumatoid arthritis  was also discussed at length.   Orders: Orders Placed This Encounter  Procedures  . XR Hand 2 View Right  . XR Hand 2 View Left  . XR KNEE 3 VIEW LEFT  . XR Foot 2 Views Right  . XR Foot 2 Views Left  . CBC with Differential/Platelet  . Sedimentation rate  . Cyclic citrul peptide antibody, IgG  . 14-3-3 eta Protein  . Glucose 6 phosphate dehydrogenase  . ANA  . Hepatitis B core antibody, IgM  . Hepatitis B surface antigen  . Hepatitis C antibody   No orders of the defined types were placed in this encounter.   Face-to-face time spent with patient was 45 minutes. Greater than 50% of  time was spent in counseling and coordination of care.  Follow-Up Instructions: Return for polyarthralgia, +RF.   Bo Merino, MD  Note - This record has been created using Editor, commissioning.  Chart creation errors have been sought, but may not always  have been located. Such creation errors do not reflect on  the standard of medical care.

## 2016-11-25 ENCOUNTER — Ambulatory Visit (INDEPENDENT_AMBULATORY_CARE_PROVIDER_SITE_OTHER): Payer: BLUE CROSS/BLUE SHIELD | Admitting: Rheumatology

## 2016-11-25 ENCOUNTER — Encounter: Payer: Self-pay | Admitting: Rheumatology

## 2016-11-25 ENCOUNTER — Ambulatory Visit (INDEPENDENT_AMBULATORY_CARE_PROVIDER_SITE_OTHER): Payer: Self-pay

## 2016-11-25 VITALS — BP 117/72 | HR 76 | Ht 67.0 in | Wt 153.0 lb

## 2016-11-25 DIAGNOSIS — M79671 Pain in right foot: Secondary | ICD-10-CM

## 2016-11-25 DIAGNOSIS — Z1159 Encounter for screening for other viral diseases: Secondary | ICD-10-CM

## 2016-11-25 DIAGNOSIS — Z720 Tobacco use: Secondary | ICD-10-CM | POA: Diagnosis not present

## 2016-11-25 DIAGNOSIS — Z87898 Personal history of other specified conditions: Secondary | ICD-10-CM

## 2016-11-25 DIAGNOSIS — M79641 Pain in right hand: Secondary | ICD-10-CM

## 2016-11-25 DIAGNOSIS — D649 Anemia, unspecified: Secondary | ICD-10-CM | POA: Diagnosis not present

## 2016-11-25 DIAGNOSIS — M79672 Pain in left foot: Secondary | ICD-10-CM

## 2016-11-25 DIAGNOSIS — M25562 Pain in left knee: Secondary | ICD-10-CM | POA: Diagnosis not present

## 2016-11-25 DIAGNOSIS — R768 Other specified abnormal immunological findings in serum: Secondary | ICD-10-CM

## 2016-11-25 DIAGNOSIS — M79642 Pain in left hand: Secondary | ICD-10-CM | POA: Diagnosis not present

## 2016-11-25 DIAGNOSIS — Z862 Personal history of diseases of the blood and blood-forming organs and certain disorders involving the immune mechanism: Secondary | ICD-10-CM

## 2016-11-25 LAB — CBC WITH DIFFERENTIAL/PLATELET
BASOS ABS: 60 {cells}/uL (ref 0–200)
Basophils Relative: 1 %
Eosinophils Absolute: 60 cells/uL (ref 15–500)
Eosinophils Relative: 1 %
HEMATOCRIT: 38.4 % (ref 35.0–45.0)
HEMOGLOBIN: 12 g/dL (ref 11.7–15.5)
Lymphocytes Relative: 26 %
Lymphs Abs: 1560 cells/uL (ref 850–3900)
MCH: 26.8 pg — AB (ref 27.0–33.0)
MCHC: 31.3 g/dL — AB (ref 32.0–36.0)
MCV: 85.9 fL (ref 80.0–100.0)
MONO ABS: 540 {cells}/uL (ref 200–950)
MPV: 9.4 fL (ref 7.5–12.5)
Monocytes Relative: 9 %
NEUTROS ABS: 3780 {cells}/uL (ref 1500–7800)
NEUTROS PCT: 63 %
Platelets: 331 10*3/uL (ref 140–400)
RBC: 4.47 MIL/uL (ref 3.80–5.10)
RDW: 19.3 % — ABNORMAL HIGH (ref 11.0–15.0)
WBC: 6 10*3/uL (ref 3.8–10.8)

## 2016-11-26 LAB — HEPATITIS C ANTIBODY: HCV AB: NONREACTIVE

## 2016-11-26 LAB — HEPATITIS B SURFACE ANTIGEN: HEP B S AG: NONREACTIVE

## 2016-11-26 LAB — HEPATITIS B CORE ANTIBODY, IGM: Hep B C IgM: NONREACTIVE

## 2016-11-26 LAB — CYCLIC CITRUL PEPTIDE ANTIBODY, IGG

## 2016-11-26 LAB — SEDIMENTATION RATE: Sed Rate: 3 mm/hr (ref 0–20)

## 2016-11-27 LAB — ANA: ANA: NEGATIVE

## 2016-11-30 ENCOUNTER — Telehealth: Payer: Self-pay | Admitting: Rheumatology

## 2016-11-30 DIAGNOSIS — M79672 Pain in left foot: Secondary | ICD-10-CM | POA: Diagnosis not present

## 2016-11-30 LAB — GLUCOSE 6 PHOSPHATE DEHYDROGENASE: G-6PDH: 15 U/g{Hb} (ref 7.0–20.5)

## 2016-11-30 LAB — 14-3-3 ETA PROTEIN: 14-3-3 eta Protein: 2.7 ng/mL — ABNORMAL HIGH (ref <0.2)

## 2016-11-30 NOTE — Telephone Encounter (Signed)
Please review/ advise. Patient wants [place] with her PCP since she complains of severe foot pain.

## 2016-11-30 NOTE — Telephone Encounter (Signed)
Please schedule 8:15 am any morning. Thank you.

## 2016-11-30 NOTE — Telephone Encounter (Signed)
Please sch [place] bilat feet

## 2016-11-30 NOTE — Progress Notes (Signed)
Labs are c/w RA +CCP Ab, +14-3-3 eta. Sh also had +RF. Plan start on Tt next visit.

## 2016-11-30 NOTE — Telephone Encounter (Signed)
LMOM for patient to call back to schedule

## 2016-11-30 NOTE — Telephone Encounter (Signed)
Patient going to GP today at 1:00p due to extreme foot pain. Patient  out of work today because she is unable to weight bear due to pain. Patient states GP has offered to do Ultrasound on Thursday. Patient has [place] appt scheduled here on 03/31/17. Please call patient to advise.

## 2016-12-01 NOTE — Progress Notes (Signed)
Office Visit Note  Patient: Alison Lowery             Date of Birth: March 03, 1976           MRN: [id number]             PCP: [facility] [place] Visit Date: 12/03/2016 Occupation: _0 @    Subjective:  Pain and swelling in hands and feet.   History of Present Illness: Alison Lowery is a 41 y.o. female with history of polyarthralgia and positive rheumatoid factor. Patient states that she continues to have frequent flares with increased pain and swelling in multiple joints. She states she had a flare last week with increased pain and swelling in multiple joints. The symptoms are better now. Although she's still having discomfort in her hands and feet.  Activities of Daily Living:  Patient reports morning stiffness for 1 hour.   Patient Denies nocturnal pain.  Difficulty dressing/grooming: Denies Difficulty climbing stairs: Reports Difficulty getting out of chair: Reports Difficulty using hands for taps, buttons, cutlery, and/or writing: Reports   Review of Systems  Constitutional: Negative for fatigue, night sweats, weight gain, weight loss and weakness.  HENT: Negative for mouth sores, trouble swallowing, trouble swallowing, mouth dryness and nose dryness.   Eyes: Negative for pain, redness, visual disturbance and dryness.  Respiratory: Negative for cough, shortness of breath and difficulty breathing.   Cardiovascular: Negative for chest pain, palpitations, hypertension, irregular heartbeat and swelling in legs/feet.  Gastrointestinal: Negative for blood in stool, constipation and diarrhea.  Endocrine: Negative for increased urination.  Genitourinary: Negative for vaginal dryness.  Musculoskeletal: Positive for arthralgias, joint pain, joint swelling and morning stiffness. Negative for myalgias, muscle weakness, muscle tenderness and myalgias.  Skin: Negative for color change, rash, hair loss, skin tightness, ulcers and sensitivity to  sunlight.  Allergic/Immunologic: Negative for susceptible to infections.  Neurological: Negative for dizziness, memory loss and night sweats.  Hematological: Negative for swollen glands.  Psychiatric/Behavioral: Negative for depressed mood and sleep disturbance. The patient is not nervous/anxious.     PMFS History:  Patient Active Problem List   Diagnosis Date Noted  . Rheumatoid arthritis involving multiple sites with positive rheumatoid factor, positive anti-CCP, +14 33 eta 12/03/2016  . Elevated rheumatoid factor 11/18/2016  . Tobacco abuse 11/18/2016  . History of abnormal electrocardiogram 11/18/2016  . History of anemia 11/18/2016  . History of diarrhea 11/18/2016  . Breast mass, right 10/21/2010    Past Medical History:  Diagnosis Date  . Ectopic pregnancy     History reviewed. No pertinent family history. History reviewed. No pertinent surgical history. Social History   Social History Narrative  . No narrative on file     Objective: Vital Signs: BP 126/77   Pulse 77   Resp 14   Ht _1  (1.753 m)   Wt 153 lb (69.4 kg)   BMI 22.59 kg/m    Physical Exam  Constitutional: She is oriented to person, place, and time. She appears well-developed and well-nourished.  HENT:  Head: Normocephalic and atraumatic.  Eyes: Conjunctivae and EOM are normal.  Neck: Normal range of motion.  Cardiovascular: Normal rate, regular rhythm, normal heart sounds and intact distal pulses.   Pulmonary/Chest: Effort normal and breath sounds normal.  Abdominal: Soft. Bowel sounds are normal.  Lymphadenopathy:    She has no cervical adenopathy.  Neurological: She is alert and oriented to person, place, and time.  Skin: Skin is warm and dry. Capillary refill takes  less than 2 seconds.  Psychiatric: She has a normal mood and affect. Her behavior is normal.  Nursing note and vitals reviewed.    Musculoskeletal Exam: C-spine and thoracic lumbar spine good range of motion. Shoulder joints  elbow joints wrist joints MCPs PIPs DIPs with good range of motion. She has tenderness on palpation over left first MCP joint. Hip joints knee joints ankles MTPs PIPs DIPs with good range of motion. She is tenderness across bilateral MTP joints. No obvious synovitis was noted. CDAI Exam: CDAI Homunculus Exam:   Tenderness:  Right hand: 1st MCP Right foot: 1st MTP, 2nd MTP, 3rd MTP, 4th MTP and 5th MTP Left foot: 1st MTP, 2nd MTP, 3rd MTP, 4th MTP and 5th MTP  Joint Counts:  CDAI Tender Joint count: 1 CDAI Swollen Joint count: 0  Global Assessments:  Patient Global Assessment: 5 Provider Global Assessment: 5  CDAI Calculated Score: 11    Investigation: No additional findings. 11/25/2016 CBC normal, ESR 3, G6PD normal, ANA negative, hepatitis panel negative, anti-CCP greater than 250, 14 33 eta 2.7  Imaging: [place] Extrem Low Left Ltd  Result Date: 12/03/2016 Ultrasound examination of bilateral hands was performed per EULAR recommendations. Using 12 MHz transducer, grayscale and power Doppler left first second and fourth and fifth MTP joints both dorsal and plantar aspects were evaluated. She synovitis in her first second fourth and fifth MTP joints. Effusion was noted in the left second MTP joint. Impression: Ultrasound examination was consistent with inflammatory arthritis.  [place] Extrem Low Right Ltd  Result Date: 12/03/2016 Ultrasound examination of bilateral hands was performed per EULAR recommendations. Using 12 MHz transducer, grayscale and power Doppler right first second and fourth and fifth MTP joints both dorsal and plantar aspects were evaluated to look for synovitis or tenosynovitis. She has synovitis in the first MTP joint. Impression: Ultrasound examination was consistent with inflammatory arthritis.  Xr Foot 2 Views Left  Result Date: 11/25/2016 First MTP narrowing and valgus deformity. All other MTPs, PIPs DIPs were within normal limits. No joint space narrowing or erosive  changes were noted. Impression: Mild osteoarthritis of the foot  Xr Foot 2 Views Right  Result Date: 11/25/2016 First MTP narrowing and valgus deformity. All other MTPs, PIPs DIPs were within normal limits. No joint space narrowing or erosive changes were noted. Impression: Mild osteoarthritis of the foot  Xr Hand 2 View Left  Result Date: 11/25/2016 No MCP PIP/DIP narrowing was noted. No intercarpal joint space narrowing was noted. No erosive changes were noted. Impression normal x-ray of the hand  Xr Hand 2 View Right  Result Date: 11/25/2016 No MCP PIP/DIP narrowing was noted. No intercarpal joint space narrowing was noted. No erosive changes were noted. Impression normal x-ray of the hand  Xr Knee 3 View Left  Result Date: 11/25/2016 No joint space narrowing was noted. No patellofemoral narrowing or chondrocalcinosis was noted. Impression: Normal x-ray of the knee joint.   Speciality Comments: No specialty comments available.    Procedures:  No procedures performed Allergies: Patient has no known allergies.   Assessment / Plan:     Visit Diagnoses: Rheumatoid arthritis involving multiple sites with positive rheumatoid factor, positive anti-CCP, +14 33 eta: She's been having increase arthralgias with pain in her hands and feet. She's intermittent swelling. She states she had a flare a week ago with severe swelling in her joints. Ultrasound examination obtained today showed positive synovitis in bilateral feet and some effusion and MTP joints. Detailed counseling regarding rheumatoid arthritis  plus provided. Different treatment options and their side effects were discussed. Indications side effects contraindications of methotrexate and Plaquenil were discussed. I will obtain TB gold, HIV, SPEP, immunoglobulins today. She had a recent chest x-ray. She's been advised to get pneumococcal vaccine and shingles vaccine. She's not using any contraception at this point. She is sexually active.  We discussed use of Plaquenil be safer right encounter she gets on a contraception. We can see how she responds to Plaquenil in the meantime. I've given her prescription for Plaquenil 200 mg by mouth twice a day total 60 tablets will 2 refills will be given. She will also need eye exam baseline and once a year. She will need a standing orders in a month and then every 3 months to monitor for drug toxicity.  Pain in both hands  Pain in both feet - Plan: [place] Extrem Low Left Ltd, [place] Extrem Low Right Ltd: Consistent with inflammatory arthritis.  High risk medication use: Plan methotrexate.  History of anemia: Probably secondary to chronic disease.  Tobacco abuse : Smoking cessation was discussed. Strong association of rheumatoid arthritis with his smoking was also discussed.   Orders: Orders Placed This Encounter  Procedures  . [place] Extrem Low Left Ltd  . [place] Extrem Low Right Ltd   No orders of the defined types were placed in this encounter.   Face-to-face time spent with patient was 40 minutes. 50% of time was spent in counseling and coordination of care.  Follow-Up Instructions: in 1 month.   Bo Merino, MD  Note - This record has been created using Editor, commissioning.  Chart creation errors have been sought, but may not always  have been located. Such creation errors do not reflect on  the standard of medical care.

## 2016-12-03 ENCOUNTER — Encounter: Payer: Self-pay | Admitting: Rheumatology

## 2016-12-03 ENCOUNTER — Inpatient Hospital Stay (INDEPENDENT_AMBULATORY_CARE_PROVIDER_SITE_OTHER): Payer: Self-pay

## 2016-12-03 ENCOUNTER — Ambulatory Visit (INDEPENDENT_AMBULATORY_CARE_PROVIDER_SITE_OTHER): Payer: BLUE CROSS/BLUE SHIELD | Admitting: Rheumatology

## 2016-12-03 ENCOUNTER — Telehealth: Payer: Self-pay | Admitting: Rheumatology

## 2016-12-03 VITALS — BP 126/77 | HR 77 | Resp 14 | Ht 69.0 in | Wt 153.0 lb

## 2016-12-03 DIAGNOSIS — M0579 Rheumatoid arthritis with rheumatoid factor of multiple sites without organ or systems involvement: Secondary | ICD-10-CM

## 2016-12-03 DIAGNOSIS — M79671 Pain in right foot: Secondary | ICD-10-CM | POA: Diagnosis not present

## 2016-12-03 DIAGNOSIS — Z79899 Other long term (current) drug therapy: Secondary | ICD-10-CM

## 2016-12-03 DIAGNOSIS — M79642 Pain in left hand: Secondary | ICD-10-CM | POA: Diagnosis not present

## 2016-12-03 DIAGNOSIS — Z9225 Personal history of immunosupression therapy: Secondary | ICD-10-CM

## 2016-12-03 DIAGNOSIS — M79641 Pain in right hand: Secondary | ICD-10-CM

## 2016-12-03 DIAGNOSIS — Z862 Personal history of diseases of the blood and blood-forming organs and certain disorders involving the immune mechanism: Secondary | ICD-10-CM

## 2016-12-03 DIAGNOSIS — Z720 Tobacco use: Secondary | ICD-10-CM

## 2016-12-03 DIAGNOSIS — M79672 Pain in left foot: Secondary | ICD-10-CM | POA: Diagnosis not present

## 2016-12-03 MED ORDER — HYDROXYCHLOROQUINE SULFATE 200 MG PO TABS: 200.0000 mg | ORAL_TABLET | Freq: Two times a day (BID) | ORAL | 2 refills | Status: DC

## 2016-12-03 NOTE — Patient Instructions (Addendum)
Methotrexate injection What is this medicine? METHOTREXATE (METH oh TREX ate) is a chemotherapy drug used to treat cancer including breast cancer, leukemia, and lymphoma. This medicine can also be used to treat psoriasis and certain kinds of arthritis. This medicine may be used for other purposes; ask your health care provider or pharmacist if you have questions. What should I tell my health care provider before I take this medicine? They need to know if you have any of these conditions: -fluid in the stomach area or lungs -if you often drink alcohol -infection or immune system problems -kidney disease -liver disease -low blood counts, like low white cell, platelet, or red cell counts -lung disease -radiation therapy -stomach ulcers -ulcerative colitis -an unusual or allergic reaction to methotrexate, other medicines, foods, dyes, or preservatives -pregnant or trying to get pregnant -breast-feeding How should I use this medicine? This medicine is for infusion into a vein or for injection into muscle or into the spinal fluid (whichever applies). It is usually given by a health care professional in a hospital or clinic setting. In rare cases, you might get this medicine at home. You will be taught how to give this medicine. Use exactly as directed. Take your medicine at regular intervals. Do not take your medicine more often than directed. If this medicine is used for arthritis or psoriasis, it should be taken weekly, NOT daily. It is important that you put your used needles and syringes in a special sharps container. Do not put them in a trash can. If you do not have a sharps container, call your pharmacist or healthcare provider to get one. Talk to your pediatrician regarding the use of this medicine in children. While this drug may be prescribed for children as young as 2 years for selected conditions, precautions do apply. Overdosage: If you think you have taken too much of this medicine  contact a poison control center or emergency room at once. NOTE: This medicine is only for you. Do not share this medicine with others. What if I miss a dose? It is important not to miss your dose. Call your doctor or health care professional if you are unable to keep an appointment. If you give yourself the medicine and you miss a dose, talk with your doctor or health care professional. Do not take double or extra doses. What may interact with this medicine? This medicine may interact with the following medications: -acitretin -aspirin or aspirin-like medicines including salicylates -azathioprine -certain antibiotics like chloramphenicol, penicillin, tetracycline -certain medicines for stomach problems like esomeprazole, omeprazole, pantoprazole -cyclosporine -gold -hydroxychloroquine -live virus vaccines -mercaptopurine -NSAIDs, medicines for pain and inflammation, like ibuprofen or naproxen -other cytotoxic agents -penicillamine -phenylbutazone -phenytoin -probenacid -retinoids such as isotretinoin and tretinoin -steroid medicines like prednisone or cortisone -sulfonamides like sulfasalazine and trimethoprim/sulfamethoxazole -theophylline This list may not describe all possible interactions. Give your health care provider a list of all the medicines, herbs, non-prescription drugs, or dietary supplements you use. Also tell them if you smoke, drink alcohol, or use illegal drugs. Some items may interact with your medicine. What should I watch for while using this medicine? Avoid alcoholic drinks. In some cases, you may be given additional medicines to help with side effects. Follow all directions for their use. This medicine can make you more sensitive to the sun. Keep out of the sun. If you cannot avoid being in the sun, wear protective clothing and use sunscreen. Do not use sun lamps or tanning beds/booths. You may get drowsy   or dizzy. Do not drive, use machinery, or do anything that  needs mental alertness until you know how this medicine affects you. Do not stand or sit up quickly, especially if you are an older patient. This reduces the risk of dizzy or fainting spells. You may need blood work done while you are taking this medicine. Call your doctor or health care professional for advice if you get a fever, chills or sore throat, or other symptoms of a cold or flu. Do not treat yourself. This drug decreases your body's ability to fight infections. Try to avoid being around people who are sick. This medicine may increase your risk to bruise or bleed. Call your doctor or health care professional if you notice any unusual bleeding. Check with your doctor or health care professional if you get an attack of severe diarrhea, nausea and vomiting, or if you sweat a lot. The loss of too much body fluid can make it dangerous for you to take this medicine. Talk to your doctor about your risk of cancer. You may be more at risk for certain types of cancers if you take this medicine. Both men and women must use effective birth control with this medicine. Do not become pregnant while taking this medicine or until at least 1 normal menstrual cycle has occurred after stopping it. Women should inform their doctor if they wish to become pregnant or think they might be pregnant. Men should not father a child while taking this medicine and for 3 months after stopping it. There is a potential for serious side effects to an unborn child. Talk to your health care professional or pharmacist for more information. Do not breast-feed an infant while taking this medicine. What side effects may I notice from receiving this medicine? Side effects that you should report to your doctor or health care professional as soon as possible: -allergic reactions like skin rash, itching or hives, swelling of the face, lips, or tongue -back pain -breathing problems or shortness of breath -confusion -diarrhea -dry,  nonproductive cough -low blood counts - this medicine may decrease the number of Rede blood cells, red blood cells and platelets. You may be at increased risk of infections and bleeding -mouth sores -redness, blistering, peeling or loosening of the skin, including inside the mouth -seizures -severe headaches -signs of infection - fever or chills, cough, sore throat, pain or difficulty passing urine -signs and symptoms of bleeding such as bloody or black, tarry stools; red or dark-brown urine; spitting up blood or brown material that looks like coffee grounds; red spots on the skin; unusual bruising or bleeding from the eye, gums, or nose -signs and symptoms of kidney injury like trouble passing urine or change in the amount of urine -signs and symptoms of liver injury like dark yellow or brown urine; general ill feeling or flu-like symptoms; light-colored stools; loss of appetite; nausea; right upper belly pain; unusually weak or tired; yellowing of the eyes or skin -stiff neck -vomiting Side effects that usually do not require medical attention (report to your doctor or health care professional if they continue or are bothersome): -dizziness -hair loss -headache -stomach pain -upset stomach This list may not describe all possible side effects. Call your doctor for medical advice about side effects. You may report side effects to FDA at 1-800-FDA-1088. Where should I keep my medicine? If you are using this medicine at home, you will be instructed on how to store this medicine. Throw away any unused medicine after   the expiration date on the label. NOTE: This sheet is a summary. It may not cover all possible information. If you have questions about this medicine, talk to your doctor, pharmacist, or health care provider.  2018 Elsevier/Gold Standard (2014-07-19 12:36:41)Hydroxychloroquine tablets What is this medicine? HYDROXYCHLOROQUINE (hye drox ee KLOR oh kwin) is used to treat rheumatoid  arthritis and systemic lupus erythematosus. It is also used to treat malaria. This medicine may be used for other purposes; ask your health care provider or pharmacist if you have questions. COMMON BRAND NAME(S): Plaquenil, Quineprox What should I tell my health care provider before I take this medicine? They need to know if you have any of these conditions: -diabetes -eye disease, vision problems -G6PD deficiency -history of blood diseases -history of irregular heartbeat -if you often drink alcohol -kidney disease -liver disease -porphyria -psoriasis -seizures -an unusual or allergic reaction to chloroquine, hydroxychloroquine, other medicines, foods, dyes, or preservatives -pregnant or trying to get pregnant -breast-feeding How should I use this medicine? Take this medicine by mouth with a glass of water. Follow the directions on the prescription label. Avoid taking antacids within 4 hours of taking this medicine. It is best to separate these medicines by at least 4 hours. Do not cut, crush or chew this medicine. You can take it with or without food. If it upsets your stomach, take it with food. Take your medicine at regular intervals. Do not take your medicine more often than directed. Take all of your medicine as directed even if you think you are better. Do not skip doses or stop your medicine early. Talk to your pediatrician regarding the use of this medicine in children. While this drug may be prescribed for selected conditions, precautions do apply. Overdosage: If you think you have taken too much of this medicine contact a poison control center or emergency room at once. NOTE: This medicine is only for you. Do not share this medicine with others. What if I miss a dose? If you miss a dose, take it as soon as you can. If it is almost time for your next dose, take only that dose. Do not take double or extra doses. What may interact with this medicine? Do not take this medicine with  any of the following medications: -cisapride -dofetilide -dronedarone -live virus vaccines -penicillamine -pimozide -thioridazine -ziprasidone This medicine may also interact with the following medications: -ampicillin -antacids -cimetidine -cyclosporine -digoxin -medicines for diabetes, like insulin, glipizide, glyburide -medicines for seizures like carbamazepine, phenobarbital, phenytoin -mefloquine -methotrexate -other medicines that prolong the QT interval (cause an abnormal heart rhythm) -praziquantel This list may not describe all possible interactions. Give your health care provider a list of all the medicines, herbs, non-prescription drugs, or dietary supplements you use. Also tell them if you smoke, drink alcohol, or use illegal drugs. Some items may interact with your medicine. What should I watch for while using this medicine? Tell your doctor or healthcare professional if your symptoms do not start to get better or if they get worse. Avoid taking antacids within 4 hours of taking this medicine. It is best to separate these medicines by at least 4 hours. Tell your doctor or health care professional right away if you have any change in your eyesight. Your vision and blood may be tested before and during use of this medicine. This medicine can make you more sensitive to the sun. Keep out of the sun. If you cannot avoid being in the sun, wear protective clothing and use  sunscreen. Do not use sun lamps or tanning beds/booths. What side effects may I notice from receiving this medicine? Side effects that you should report to your doctor or health care professional as soon as possible: -allergic reactions like skin rash, itching or hives, swelling of the face, lips, or tongue -changes in vision -decreased hearing or ringing of the ears -redness, blistering, peeling or loosening of the skin, including inside the mouth -seizures -sensitivity to light -signs and symptoms of a  dangerous change in heartbeat or heart rhythm like chest pain; dizziness; fast or irregular heartbeat; palpitations; feeling faint or lightheaded, falls; breathing problems -signs and symptoms of liver injury like dark yellow or brown urine; general ill feeling or flu-like symptoms; light-colored stools; loss of appetite; nausea; right upper belly pain; unusually weak or tired; yellowing of the eyes or skin -signs and symptoms of low blood sugar such as feeling anxious; confusion; dizziness; increased hunger; unusually weak or tired; sweating; shakiness; cold; irritable; headache; blurred vision; fast heartbeat; loss of consciousness -uncontrollable head, mouth, neck, arm, or leg movements Side effects that usually do not require medical attention (report to your doctor or health care professional if they continue or are bothersome): -anxious -diarrhea -dizziness -hair loss -headache -irritable -loss of appetite -nausea, vomiting -stomach pain This list may not describe all possible side effects. Call your doctor for medical advice about side effects. You may report side effects to FDA at 1-800-FDA-1088. Where should I keep my medicine? Keep out of the reach of children. In children, this medicine can cause overdose with small doses. Store at room temperature between 15 and 30 degrees C (59 and 86 degrees F). Protect from moisture and light. Throw away any unused medicine after the expiration date. NOTE: This sheet is a summary. It may not cover all possible information. If you have questions about this medicine, talk to your doctor, pharmacist, or health care provider.  2018 Elsevier/Gold Standard (2015-11-13 14:16:15)  Standing Labs We placed an order today for your standing lab work.    Please come back and get your standing labs in 1 month and every 3 months.  We have open lab Monday through Friday from 8:30-11:30 AM and 1:30-4 PM at the office of Dr. Bo Merino.   The office is  located at [address] [place], [place], [place] [address] No appointment is necessary.   Labs are drawn by [facility].  You may receive a bill from [facility] for your lab work. If you have any questions regarding directions or hours of operation,  please call [phone].

## 2016-12-03 NOTE — Telephone Encounter (Signed)
Patient states rx this am did not make it to pharmacy. Please send it to [facility].

## 2016-12-03 NOTE — Telephone Encounter (Signed)
Per office note PLQ 200 mg 1 po BID # 60 with 2 refills sent to the pharmacy.  Patient advised

## 2016-12-28 NOTE — Progress Notes (Signed)
Office Visit Note  Patient: Alison Lowery             Date of Birth: [birth year]           MRN: [id number]             PCP: [facility] Referring: No ref. provider found Visit Date: 12/30/2016 Occupation: '@GUAROCC' @    Subjective:  Pain in hands and feet.   History of Present Illness: Alison Lowery is a 41 y.o. female with history of sero positive rheumatoid arthritis. She was started on Plaquenil in August. She's been on Plaquenil for about a month now. She states she has noticed improvement in her joint symptoms. She still continues to have some discomfort in her hands and feet especially in her feet. She denies any joint swelling.  Activities of Daily Living:  Patient reports morning stiffness for 30 minutes.   Patient Denies nocturnal pain.  Difficulty dressing/grooming: Denies Difficulty climbing stairs: Denies Difficulty getting out of chair: Denies Difficulty using hands for taps, buttons, cutlery, and/or writing: Denies   Review of Systems  Constitutional: Negative.  Negative for fatigue, night sweats, weight gain, weight loss and weakness.  HENT: Negative.  Negative for mouth sores, trouble swallowing, trouble swallowing, mouth dryness and nose dryness.   Eyes: Negative.  Negative for pain, redness, visual disturbance and dryness.  Respiratory: Negative for cough, shortness of breath and difficulty breathing.   Cardiovascular: Negative.  Negative for chest pain, palpitations, hypertension, irregular heartbeat and swelling in legs/feet.  Gastrointestinal: Negative.  Negative for blood in stool, constipation and diarrhea.  Endocrine: Negative for increased urination.  Genitourinary: Negative for vaginal dryness.  Musculoskeletal: Positive for arthralgias, joint pain and morning stiffness. Negative for joint swelling, myalgias, muscle weakness, muscle tenderness and myalgias.  Skin: Negative.  Negative for color change, rash, hair loss, skin tightness, ulcers and  sensitivity to sunlight.  Allergic/Immunologic: Negative for susceptible to infections.  Neurological: Negative.  Negative for dizziness, numbness, headaches, memory loss and night sweats.  Hematological: Negative for swollen glands.  Psychiatric/Behavioral: Negative.  Negative for depressed mood and sleep disturbance. The patient is not nervous/anxious.     PMFS History:  Patient Active Problem List   Diagnosis Date Noted  . Rheumatoid arthritis involving multiple sites with positive rheumatoid factor, positive anti-CCP, +14 33 eta 12/03/2016  . Elevated rheumatoid factor 11/18/2016  . Tobacco abuse 11/18/2016  . History of abnormal electrocardiogram 11/18/2016  . History of anemia 11/18/2016  . History of diarrhea 11/18/2016  . Breast mass, right 10/21/2010    Past Medical History:  Diagnosis Date  . Ectopic pregnancy     No family history on file. No past surgical history on file. Social History   Social History Narrative  . No narrative on file     Objective: Vital Signs: BP 111/72 (BP Location: Left Arm, Patient Position: Sitting, Cuff Size: Normal)   Pulse (!) 102   Ht '5\' 7"'  (1.702 m)   Wt 152 lb (68.9 kg)   BMI 23.81 kg/m    Physical Exam  Constitutional: She is oriented to person, place, and time. She appears well-developed and well-nourished.  HENT:  Head: Normocephalic and atraumatic.  Eyes: Conjunctivae and EOM are normal.  Neck: Normal range of motion.  Cardiovascular: Normal rate, regular rhythm, normal heart sounds and intact distal pulses.   Pulmonary/Chest: Effort normal and breath sounds normal.  Abdominal: Soft. Bowel sounds are normal.  Lymphadenopathy:    She has no cervical adenopathy.  Neurological: She is alert and oriented to person, place, and time.  Skin: Skin is warm and dry. Capillary refill takes less than 2 seconds.  Psychiatric: She has a normal mood and affect. Her behavior is normal.  Nursing note and vitals reviewed.     Musculoskeletal Exam: C-spine and thoracic lumbar spine good range of motion. Shoulder joints elbow joints wrist joint MCPs PIPs DIPs with good range of motion. No synovitis was noted but she had some tenderness as described below. Hip joints knee joints ankles MTPs PIPs are good range of motion except for tenderness in her left third MTP joint.  CDAI Exam: CDAI Homunculus Exam:   Tenderness:  Right hand: 1st MCP, 1st PIP and 3rd PIP Left foot: 3rd MTP  Joint Counts:  CDAI Tender Joint count: 3 CDAI Swollen Joint count: 0  Global Assessments:  Patient Global Assessment: 5 Provider Global Assessment: 5  CDAI Calculated Score: 13    Investigation: No additional findings. CBC Latest Ref Rng & Units 11/25/2016 06/09/2016 02/21/2013  WBC 3.8 - 10.8 K/uL 6.0 5.1 6.1  Hemoglobin 11.7 - 15.5 g/dL 12.0 9.8(L) 12.4  Hematocrit 35.0 - 45.0 % 38.4 31.4(L) 36.0  Platelets 140 - 400 K/uL 331 314 297   CMP     Component Value Date/Time   NA 137 06/09/2016 2125   K 3.6 06/09/2016 2125   CL 107 06/09/2016 2125   CO2 25 06/09/2016 2125   GLUCOSE 85 06/09/2016 2125   BUN 6 06/09/2016 2125   CREATININE 0.67 06/09/2016 2125   CALCIUM 9.1 06/09/2016 2125   PROT 7.0 02/21/2013 1754   ALBUMIN 3.8 02/21/2013 1754   AST 12 02/21/2013 1754   ALT 7 02/21/2013 1754   ALKPHOS 34 (L) 02/21/2013 1754   BILITOT 0.1 (L) 02/21/2013 1754   GFRNONAA >60 06/09/2016 2125   GFRAA >60 06/09/2016 2125   ESR 3, ANA-, anti-CCP>250, 14-3-3 eta 2.7(high), G6PD normal, HepB and Hep C negative Imaging: [place] Extrem Low Left Ltd  Result Date: 12/03/2016 Ultrasound examination of bilateral hands was performed per EULAR recommendations. Using 12 MHz transducer, grayscale and power Doppler left first second and fourth and fifth MTP joints both dorsal and plantar aspects were evaluated. She synovitis in her first second fourth and fifth MTP joints. Effusion was noted in the left second MTP joint. Impression:  Ultrasound examination was consistent with inflammatory arthritis.  [place] Extrem Low Right Ltd  Result Date: 12/03/2016 Ultrasound examination of bilateral hands was performed per EULAR recommendations. Using 12 MHz transducer, grayscale and power Doppler right first second and fourth and fifth MTP joints both dorsal and plantar aspects were evaluated to look for synovitis or tenosynovitis. She has synovitis in the first MTP joint. Impression: Ultrasound examination was consistent with inflammatory arthritis.   Speciality Comments: No specialty comments available.    Procedures:  No procedures performed Allergies: Patient has no known allergies.   Assessment / Plan:     Visit Diagnoses: Rheumatoid arthritis involving multiple sites with positive rheumatoid factor, positive anti-CCP, +14 33 eta - She continues to have some arthralgias. But has no synovitis on examination today. She's been on Plaquenil only for a month. I would like to give it more time. If her symptoms get worse then we may consider methotrexate. Indications side effects contraindications of methotrexate were discussed at length today. I will obtain following labs in case we have to start her on methotrexate in future.  High risk medication use - PLQ 200 mg po bid (  12/03/2016) - Plan: Protein electrophoresis, serum, IgG, IgA, IgM, HIV antibody, CBC with Differential/Platelet, COMPLETE METABOLIC PANEL WITH GFR. She will get CBC and CMP today and then every 3 months to monitor for drug toxicity.  Tobacco abuse : Association of his smoking with rheumatoid arthritis was discussed. And his smoking cessation was discussed.  History of anemia - Plan: Protein electrophoresis, serum, IgG, IgA, IgM, HIV antibody  Pain in both hands -: She continues to have some tenderness but no synovitis on examination was noted.  Pain in both feet -she still have some tenderness on palpation over MTPs but no synovitis was noted.    Orders: No  orders of the defined types were placed in this encounter.  No orders of the defined types were placed in this encounter.     Follow-Up Instructions: Return in about 3 months (around 03/31/2017) for Rheumatoid arthritis.   Bo Merino, MD  Note - This record has been created using Editor, commissioning.  Chart creation errors have been sought, but may not always  have been located. Such creation errors do not reflect on  the standard of medical care.

## 2016-12-30 ENCOUNTER — Ambulatory Visit (INDEPENDENT_AMBULATORY_CARE_PROVIDER_SITE_OTHER): Payer: BLUE CROSS/BLUE SHIELD | Admitting: Rheumatology

## 2016-12-30 ENCOUNTER — Encounter: Payer: Self-pay | Admitting: Rheumatology

## 2016-12-30 VITALS — BP 111/72 | HR 102 | Ht 67.0 in | Wt 152.0 lb

## 2016-12-30 DIAGNOSIS — M79641 Pain in right hand: Secondary | ICD-10-CM | POA: Diagnosis not present

## 2016-12-30 DIAGNOSIS — Z9225 Personal history of immunosupression therapy: Secondary | ICD-10-CM | POA: Diagnosis not present

## 2016-12-30 DIAGNOSIS — M0579 Rheumatoid arthritis with rheumatoid factor of multiple sites without organ or systems involvement: Secondary | ICD-10-CM

## 2016-12-30 DIAGNOSIS — Z79899 Other long term (current) drug therapy: Secondary | ICD-10-CM | POA: Diagnosis not present

## 2016-12-30 DIAGNOSIS — Z862 Personal history of diseases of the blood and blood-forming organs and certain disorders involving the immune mechanism: Secondary | ICD-10-CM

## 2016-12-30 DIAGNOSIS — Z720 Tobacco use: Secondary | ICD-10-CM | POA: Diagnosis not present

## 2016-12-30 DIAGNOSIS — M79671 Pain in right foot: Secondary | ICD-10-CM | POA: Diagnosis not present

## 2016-12-30 DIAGNOSIS — M79642 Pain in left hand: Secondary | ICD-10-CM

## 2016-12-30 DIAGNOSIS — M79672 Pain in left foot: Secondary | ICD-10-CM

## 2016-12-30 NOTE — Patient Instructions (Signed)
Standing Labs We placed an order today for your standing lab work.    Please come back and get your standing labs in December and every 3 months  We have open lab Monday through Friday from 8:30-11:30 AM and 1:30-4 PM at the office of Dr. Nabeeha Badertscher.   The office is located at [address], [address], [place], [place] [address] No appointment is necessary.   Labs are drawn by [facility].  You may receive a bill from [facility] for your lab work. If you have any questions regarding directions or hours of operation,  please call [phone].    

## 2017-01-01 LAB — COMPLETE METABOLIC PANEL WITH GFR
AG RATIO: 2 (calc) (ref 1.0–2.5)
ALKALINE PHOSPHATASE (APISO): 32 U/L — AB (ref 33–115)
ALT: 7 U/L (ref 6–29)
AST: 13 U/L (ref 10–30)
Albumin: 4.3 g/dL (ref 3.6–5.1)
BILIRUBIN TOTAL: 0.3 mg/dL (ref 0.2–1.2)
BUN/Creatinine Ratio: 6 (calc) (ref 6–22)
BUN: 5 mg/dL — AB (ref 7–25)
CHLORIDE: 107 mmol/L (ref 98–110)
CO2: 26 mmol/L (ref 20–32)
Calcium: 9.3 mg/dL (ref 8.6–10.2)
Creat: 0.8 mg/dL (ref 0.50–1.10)
GFR, Est African American: 105 mL/min/{1.73_m2} (ref >=60)
GFR, Est Non African American: 90 mL/min/{1.73_m2} (ref >=60)
GLUCOSE: 86 mg/dL (ref 65–99)
Globulin: 2.1 g/dL (calc) (ref 1.9–3.7)
POTASSIUM: 4.1 mmol/L (ref 3.5–5.3)
Sodium: 139 mmol/L (ref 135–146)
Total Protein: 6.4 g/dL (ref 6.1–8.1)

## 2017-01-01 LAB — IGG, IGA, IGM
IGG (IMMUNOGLOBIN G), SERUM: 994 mg/dL (ref 694–1618)
IMMUNOGLOBULIN A: 155 mg/dL (ref 81–463)
IgM, Serum: 71 mg/dL (ref 48–271)

## 2017-01-01 LAB — CBC WITH DIFFERENTIAL/PLATELET
BASOS ABS: 41 {cells}/uL (ref 0–200)
BASOS PCT: 1 %
EOS ABS: 49 {cells}/uL (ref 15–500)
Eosinophils Relative: 1.2 %
HCT: 34.9 % — ABNORMAL LOW (ref 35.0–45.0)
Hemoglobin: 11 g/dL — ABNORMAL LOW (ref 11.7–15.5)
Lymphs Abs: 1275 cells/uL (ref 850–3900)
MCH: 27.4 pg (ref 27.0–33.0)
MCHC: 31.5 g/dL — AB (ref 32.0–36.0)
MCV: 86.8 fL (ref 80.0–100.0)
MONOS PCT: 9.7 %
MPV: 10 fL (ref 7.5–12.5)
NEUTROS PCT: 57 %
Neutro Abs: 2337 cells/uL (ref 1500–7800)
PLATELETS: 282 10*3/uL (ref 140–400)
RBC: 4.02 10*6/uL (ref 3.80–5.10)
RDW: 15.6 % — ABNORMAL HIGH (ref 11.0–15.0)
TOTAL LYMPHOCYTE: 31.1 %
WBC mixed population: 398 cells/uL (ref 200–950)
WBC: 4.1 10*3/uL (ref 3.8–10.8)

## 2017-01-01 LAB — PROTEIN ELECTROPHORESIS, SERUM
ALBUMIN ELP: 3.9 g/dL (ref 3.8–4.8)
ALPHA 1: 0.3 g/dL (ref 0.2–0.3)
ALPHA 2: 0.6 g/dL (ref 0.5–0.9)
Beta 2: 0.3 g/dL (ref 0.2–0.5)
Beta Globulin: 0.4 g/dL (ref 0.4–0.6)
GAMMA GLOBULIN: 0.9 g/dL (ref 0.8–1.7)
Total Protein: 6.4 g/dL (ref 6.1–8.1)

## 2017-01-01 LAB — QUANTIFERON TB GOLD ASSAY (BLOOD)
Mitogen-Nil: >10 IU/mL
QUANTIFERON NIL VALUE: 0.03 [IU]/mL
QUANTIFERON TB AG MINUS NIL: 0.03 [IU]/mL
QUANTIFERON(R)-TB GOLD: NEGATIVE

## 2017-01-01 LAB — HIV ANTIBODY (ROUTINE TESTING W REFLEX): HIV 1&2 Ab, 4th Generation: NONREACTIVE

## 2017-01-02 NOTE — Progress Notes (Signed)
Mild anemia, rest of the labs are normal.

## 2017-01-04 ENCOUNTER — Telehealth: Payer: Self-pay | Admitting: Rheumatology

## 2017-01-04 NOTE — Telephone Encounter (Signed)
Patient calling in reference to lab results. Had a few questions. Please call to advise.

## 2017-01-04 NOTE — Telephone Encounter (Signed)
Attempted to contact the patient and left message for patient to call the office.  

## 2017-01-04 NOTE — Telephone Encounter (Signed)
Reviewed lab results with patient and verbalized understanding.

## 2017-03-31 ENCOUNTER — Other Ambulatory Visit: Payer: BLUE CROSS/BLUE SHIELD | Admitting: Rheumatology

## 2017-04-08 ENCOUNTER — Encounter (HOSPITAL_COMMUNITY): Payer: Self-pay | Admitting: *Deleted

## 2017-04-08 ENCOUNTER — Inpatient Hospital Stay (HOSPITAL_COMMUNITY)
Admission: AD | Admit: 2017-04-08 | Discharge: 2017-04-08 | Disposition: A | Discharge status: Home / Self Care | Payer: BLUE CROSS/BLUE SHIELD | Source: Ambulatory Visit | Attending: Family Medicine | Admitting: Family Medicine

## 2017-04-08 DIAGNOSIS — N939 Abnormal uterine and vaginal bleeding, unspecified: Secondary | ICD-10-CM | POA: Diagnosis not present

## 2017-04-08 DIAGNOSIS — F17210 Nicotine dependence, cigarettes, uncomplicated: Secondary | ICD-10-CM | POA: Diagnosis not present

## 2017-04-08 DIAGNOSIS — Z79899 Other long term (current) drug therapy: Secondary | ICD-10-CM | POA: Diagnosis not present

## 2017-04-08 LAB — POCT PREGNANCY, URINE: PREG TEST UR: NEGATIVE

## 2017-04-08 LAB — URINALYSIS, ROUTINE W REFLEX MICROSCOPIC
Bilirubin Urine: NEGATIVE
Glucose, UA: NEGATIVE mg/dL
Ketones, ur: NEGATIVE mg/dL
LEUKOCYTES UA: NEGATIVE
Nitrite: NEGATIVE
PROTEIN: 30 mg/dL — AB
Specific Gravity, Urine: 1.027 (ref 1.005–1.030)
pH: 5 (ref 5.0–8.0)

## 2017-04-08 LAB — CBC
HCT: 32.7 % — ABNORMAL LOW (ref 36.0–46.0)
HEMOGLOBIN: 10.4 g/dL — AB (ref 12.0–15.0)
MCH: 28.6 pg (ref 26.0–34.0)
MCHC: 31.8 g/dL (ref 30.0–36.0)
MCV: 89.8 fL (ref 78.0–100.0)
Platelets: 286 10*3/uL (ref 150–400)
RBC: 3.64 MIL/uL — ABNORMAL LOW (ref 3.87–5.11)
RDW: 14.8 % (ref 11.5–15.5)
WBC: 5.4 10*3/uL (ref 4.0–10.5)

## 2017-04-08 NOTE — MAU Note (Signed)
Pt presents with complaint of bleeding since Sunday, bleeding is getting heavier. States she is cramping. Dx with fibroids earlier this year. Currently changing a pad q 30 minutes

## 2017-04-08 NOTE — Discharge Instructions (Signed)
In late 2019, the [facility] will be moving to the [place]. At that time, the MAU (Maternity Admissions Unit), where you are being seen today, will no longer take care of non-pregnant patients. We strongly encourage you to find a doctor's office before that time, so that you can be seen with any GYN concerns, like vaginal discharge, urinary tract infection, etc.. in a timely manner.  In order to make an office visit more convenient, the [facility] [place] at [facility] will be offering evening hours with same-day appointments, walk-in appointments and scheduled appointments available during this time.  [facility] for [facility] @ [facility] Hours: Monday - 8am - 7:30 pm with walk-in between 4pm- 7:30 pm Tuesday - 8 am - 5 pm (starting 07/13/17 we will be open late and accepting walk-ins from 4pm - 7:30pm) Wednesday - 8 am - 5 pm (starting 10/13/17 we will be open late and accepting walk-ins from 4pm - 7:30pm) Thursday 8 am - 5 pm (starting 01/13/18 we will be open late and accepting walk-ins from 4pm - 7:30pm) Friday 8 am - 5 pm  For an appointment please call the [facility] [place] @ [facility] at [phone]  For urgent needs, [facility] is also available for management of urgent GYN complaints such as vaginal discharge or urinary tract infections.     Primary care follow up  Sick[facility] Cell Internal Medicine (will see you even if you do not have sickle cell): [phone] [facility] Internal Medicine: [phone] [facility] and [facility]: [phone]    Abnormal Uterine Bleeding Abnormal uterine bleeding means bleeding more than usual from your uterus. It can include:  Bleeding between periods.  Bleeding after sex.  Bleeding that is heavier than normal.  Periods that last longer than usual.  Bleeding after you have stopped having your period (menopause).  There are many problems that may cause this. You should see a  doctor for any kind of bleeding that is not normal. Treatment depends on the cause of the bleeding. Follow these instructions at home:  Watch your condition for any changes.  Do not use tampons, douche, or have sex, if your doctor tells you not to.  Change your pads often.  Get regular well-woman exams. Make sure they include a pelvic exam and cervical cancer screening.  Keep all follow-up visits as told by your doctor. This is important. Contact a doctor if:  The bleeding lasts more than one week.  You feel dizzy at times.  You feel like you are going to throw up (nauseous).  You throw up. Get help right away if:  You pass out.  You have to change pads every hour.  You have belly (abdominal) pain.  You have a fever.  You get sweaty.  You get weak.  You passing large blood clots from your vagina. Summary  Abnormal uterine bleeding means bleeding more than usual from your uterus.  There are many problems that may cause this. You should see a doctor for any kind of bleeding that is not normal.  Treatment depends on the cause of the bleeding. This information is not intended to replace advice given to you by your health care provider. Make sure you discuss any questions you have with your health care provider. Document Released: 01/25/2009 Document Revised: 03/24/2016 Document Reviewed: 03/24/2016 Elsevier Interactive Patient Education  2017 [employer].

## 2017-04-08 NOTE — MAU Provider Note (Signed)
History     CSN: [id number]  Arrival date and time: 04/08/17 1757   None     Chief Complaint  Patient presents with  . Vaginal Bleeding   Vaginal Bleeding  The patient's primary symptoms include pelvic pain and vaginal bleeding. This is a new problem. Episode onset: 04/04/17. The problem occurs constantly. The problem has been unchanged. Pain severity now: 6/10. She is not pregnant. Pertinent negatives include no chills, dysuria, fever, frequency, nausea, urgency or vomiting. The vaginal discharge was bloody. The vaginal bleeding is heavier than menses. She has been passing clots (about the size of a grape). She has not been passing tissue. Nothing aggravates the symptoms. She has tried nothing for the symptoms. She uses nothing for contraception. Her menstrual history has been regular (LMP 04/04/17).    Past Medical History:  Diagnosis Date  . Ectopic pregnancy     No past surgical history on file.  No family history on file.  Social History   Tobacco Use  . Smoking status: Current Every Day Smoker    Packs/day: 1.00    Types: Cigarettes  . Smokeless tobacco: Never Used  Substance Use Topics  . Alcohol use: Yes    Comment: occ  . Drug use: No    Allergies: No Known Allergies  Medications Prior to Admission  Medication Sig Dispense Refill Last Dose  . hydroxychloroquine (PLAQUENIL) 200 MG tablet Take 1 tablet (200 mg total) by mouth 2 (two) times daily. 60 tablet 2 Taking    Review of Systems  Constitutional: Negative for chills and fever.  Gastrointestinal: Negative for nausea and vomiting.  Genitourinary: Positive for pelvic pain and vaginal bleeding. Negative for dysuria, frequency and urgency.  Neurological: Positive for weakness.   Physical Exam   Blood pressure 101/69, pulse 78, temperature 98.1 F (36.7 C), temperature source Oral, resp. rate 18, height 5\' 7"  (1.702 m), weight 148 lb (67.1 kg), last menstrual period 04/04/2017, SpO2 100 %.  Physical  Exam  Nursing note and vitals reviewed. Constitutional: She is oriented to person, place, and time. She appears well-developed and well-nourished. No distress.  HENT:  Head: Normocephalic.  Cardiovascular: Normal rate.  Respiratory: Effort normal.  GI: Soft. There is no tenderness. There is no rebound.  Neurological: She is alert and oriented to person, place, and time.  Skin: Skin is warm and dry.  Psychiatric: She has a normal mood and affect.     Results for orders placed or performed during the hospital encounter of 04/08/17 (from the past 24 hour(s))  Urinalysis, Routine w reflex microscopic     Status: Abnormal   Collection Time: 04/08/17  7:05 PM  Result Value Ref Range   Color, Urine YELLOW YELLOW   APPearance CLOUDY (A) CLEAR   Specific Gravity, Urine 1.027 1.005 - 1.030   pH 5.0 5.0 - 8.0   Glucose, UA NEGATIVE NEGATIVE mg/dL   Hgb urine dipstick LARGE (A) NEGATIVE   Bilirubin Urine NEGATIVE NEGATIVE   Ketones, ur NEGATIVE NEGATIVE mg/dL   Protein, ur 30 (A) NEGATIVE mg/dL   Nitrite NEGATIVE NEGATIVE   Leukocytes, UA NEGATIVE NEGATIVE   RBC / HPF TOO NUMEROUS TO COUNT 0 - 5 RBC/hpf   WBC, UA 0-5 0 - 5 WBC/hpf   Bacteria, UA RARE (A) NONE SEEN   Squamous Epithelial / LPF 0-5 (A) NONE SEEN   Mucus PRESENT    Ca Oxalate Crys, UA PRESENT   Pregnancy, urine POC     Status: None  Collection Time: 04/08/17  7:20 PM  Result Value Ref Range   Preg Test, Ur NEGATIVE NEGATIVE  CBC     Status: Abnormal   Collection Time: 04/08/17  8:01 PM  Result Value Ref Range   WBC 5.4 4.0 - 10.5 K/uL   RBC 3.64 (L) 3.87 - 5.11 MIL/uL   Hemoglobin 10.4 (L) 12.0 - 15.0 g/dL   HCT 32.7 (L) 36.0 - 46.0 %   MCV 89.8 78.0 - 100.0 fL   MCH 28.6 26.0 - 34.0 pg   MCHC 31.8 30.0 - 36.0 g/dL   RDW 14.8 11.5 - 15.5 %   Platelets 286 150 - 400 K/uL    MAU Course  Procedures  MDM   Assessment and Plan   1. Abnormal uterine bleeding    DC home Comfort measures reviewed  Bleeding  precautions RX: none, outpatient [place] ordered  Return to MAU as needed FU with OB as planned  [facility] [place] Follow up.   Specialty:  Obstetrics and Gynecology Why:  They will call you with an appointment  Contact information: [place] 04/08/2017, 8:01 PM

## 2017-04-20 ENCOUNTER — Ambulatory Visit (HOSPITAL_COMMUNITY): Admission: RE | Admit: 2017-04-20 | Payer: BLUE CROSS/BLUE SHIELD | Source: Ambulatory Visit

## 2017-04-28 ENCOUNTER — Telehealth: Payer: Self-pay | Admitting: Rheumatology

## 2017-04-28 NOTE — Progress Notes (Signed)
Office Visit Note  Patient: Alison Lowery             Date of Birth: [birth year]           MRN: [id number]             PCP: [facility] [place] Referring: [facility] [place] Visit Date: 04/29/2017 Occupation: @GUAROCC @    Subjective:  Bilateral feet pain    History of Present Illness: Alison Lowery is a 42 y.o. female with a history of seropositive rheumatoid arthritis.  Patient states she has been taking PLQ 200 mg BID daily.  She ran out of her prescription 1 week ago so she has not been taking.  She continues to have pain in her bilateral MTPs.  She states the pain is worse in her left 2nd and 3rd MTPs, especially with pressure.  She has pain on the plantar aspect as well.  She denies any arch or heel pain.  She denies any pain or swelling in any other joints.  She has not had a baseline PLQ eye exam.   Activities of Daily Living:  Patient reports morning stiffness for 1  hour.   Patient Denies nocturnal pain.  Difficulty dressing/grooming: Denies Difficulty climbing stairs: Denies Difficulty getting out of chair: Denies Difficulty using hands for taps, buttons, cutlery, and/or writing: Reports   Review of Systems  Constitutional: Negative for fatigue and weakness.  HENT: Negative for mouth sores and mouth dryness.   Eyes: Negative for redness and dryness.  Respiratory: Negative for cough, hemoptysis, shortness of breath and difficulty breathing.   Cardiovascular: Negative for chest pain, palpitations, hypertension, irregular heartbeat and swelling in legs/feet.  Gastrointestinal: Negative for blood in stool, constipation and diarrhea.  Endocrine: Negative for increased urination.  Genitourinary: Negative for painful urination.  Musculoskeletal: Positive for arthralgias, joint pain, joint swelling and morning stiffness. Negative for myalgias, muscle weakness, muscle tenderness and myalgias.  Skin: Positive for color change. Negative for pallor, rash, hair loss,  nodules/bumps, redness, skin tightness, ulcers and sensitivity to sunlight.  Neurological: Negative for dizziness, numbness and headaches.  Hematological: Negative for swollen glands.  Psychiatric/Behavioral: Negative for depressed mood and sleep disturbance. The patient is not nervous/anxious.     PMFS History:  Patient Active Problem List   Diagnosis Date Noted  . Rheumatoid arthritis involving multiple sites with positive rheumatoid factor, positive anti-CCP, +14 33 eta 12/03/2016  . Elevated rheumatoid factor 11/18/2016  . Tobacco abuse 11/18/2016  . History of abnormal electrocardiogram 11/18/2016  . History of anemia 11/18/2016  . History of diarrhea 11/18/2016  . Breast mass, right 10/21/2010    Past Medical History:  Diagnosis Date  . Ectopic pregnancy     History reviewed. No pertinent family history. History reviewed. No pertinent surgical history. Social History   Social History Narrative  . Not on file     Objective: Vital Signs: BP 105/65 (BP Location: Left Arm, Patient Position: Sitting, Cuff Size: Normal)   Pulse 78   Resp 17   Ht 5\' 7"  (1.702 m)   Wt 145 lb (65.8 kg)   LMP 04/04/2017   BMI 22.71 kg/m    Physical Exam  Constitutional: She is oriented to person, place, and time. She appears well-developed and well-nourished.  HENT:  Head: Normocephalic and atraumatic.  Eyes: Conjunctivae and EOM are normal.  Neck: Normal range of motion.  Cardiovascular: Normal rate, regular rhythm, normal heart sounds and intact distal pulses.  Pulmonary/Chest: Effort normal and breath sounds normal.  Abdominal: Soft. Bowel sounds are normal.  Lymphadenopathy:    She has no cervical adenopathy.  Neurological: She is alert and oriented to person, place, and time.  Skin: Skin is warm and dry. Capillary refill takes less than 2 seconds.  Psychiatric: She has a normal mood and affect. Her behavior is normal.  Nursing note and vitals reviewed.    Musculoskeletal  Exam: C-spine, thoracic, and lumbar spine good ROM.  Shoulder joints, elbow joints, wrist joints, MCPs, PIPs, DIPs good ROM with no synovitis.  Hip joints, knee joints, ankle joints, MTPs, PIPs, and DIPs good ROM with no synovitis.  She has tenderness of left 2nd and 3rd MTPs but no obvious synovitis.  No midline spinal tenderness or SI joint tenderness.  No trochanteric bursitis.    CDAI Exam: CDAI Homunculus Exam:   Tenderness:  Left foot: 2nd MTP and 3rd MTP  Swelling:  Left foot: 2nd MTP and 3rd MTP  Joint Counts:  CDAI Tender Joint count: 0 CDAI Swollen Joint count: 0  Global Assessments:  Patient Global Assessment: 5 Provider Global Assessment: 4  CDAI Calculated Score: 9    Investigation: No additional findings. CBC Latest Ref Rng & Units 04/08/2017 12/30/2016 11/25/2016  WBC 4.0 - 10.5 K/uL 5.4 4.1 6.0  Hemoglobin 12.0 - 15.0 g/dL 10.4(L) 11.0(L) 12.0  Hematocrit 36.0 - 46.0 % 32.7(L) 34.9(L) 38.4  Platelets 150 - 400 K/uL 286 282 331   CMP Latest Ref Rng & Units 12/30/2016 12/30/2016 06/09/2016  Glucose 65 - 99 mg/dL 86 - 85  BUN 7 - 25 mg/dL 5(L) - 6  Creatinine 0.50 - 1.10 mg/dL 0.80 - 0.67  Sodium 135 - 146 mmol/L 139 - 137  Potassium 3.5 - 5.3 mmol/L 4.1 - 3.6  Chloride 98 - 110 mmol/L 107 - 107  CO2 20 - 32 mmol/L 26 - 25  Calcium 8.6 - 10.2 mg/dL 9.3 - 9.1  Total Protein 6.1 - 8.1 g/dL 6.4 6.4 -  Total Bilirubin 0.2 - 1.2 mg/dL 0.3 - -  Alkaline Phos 39 - 117 U/L - - -  AST 10 - 30 U/L 13 - -  ALT 6 - 29 U/L 7 - -    Imaging: No results found.  Speciality Comments: No specialty comments available.    Procedures:  No procedures performed Allergies: Patient has no known allergies.         Assessment / Plan:     Visit Diagnoses: Rheumatoid arthritis involving multiple sites with positive rheumatoid factor, positive anti-CCP, +14 33 eta: She has tenderness of left 2nd and 3rd MTPs.  No obvious synovitis on exam.  She needs a refill of PLQ.  She  has been taking PLQ 200 mg BID daily.  She had x-rays of her bilateral feet performed on 11/25/16 which did not reveal erosive changes.  She had mild osteoarthritis of bilateral feet.  At her previous visit we discussed starting her on MTX if she continues to have joint pain and joint swelling.Patient was counseled on the purpose, proper use, and adverse effects of methotrexate including nausea, infection, and signs and symptoms of pneumonitis.  Reviewed instructions with patient to take methotrexate weekly along with folic acid daily.  Discussed the importance of frequent monitoring of kidney and liver function and blood counts, and provided patient with standing lab instructions.  Provided patient with educational materials on methotrexate and answered all questions.  She would like to continue taking PLQ and wait to start MTX after she has quit smoking.  She will follow up in 3 months and we scheduled an ultrasound of her bilateral feet in 2 months.  If she has synovitis on exam, we will discuss initiating MTX.    High risk medication use -She was advised to schedule a plaquenil eye exam.  She has not gone for her baseline eye exam.  She was given an eye exam form to take with her to her appointment.  CBC and CMP were drawn today to monitor for drug toxicity.    - Plan: CBC with Differential/Platelet, COMPLETE METABOLIC PANEL WITH GFR  History of anemia: CBC on 04/08/17 revealed anemia.  CBC was drawn today.   Tobacco abuse: She was counseled on tobacco cessation.  She is going to try to quit between now and her next appointment.    Association of heart disease with rheumatoid arthritis was discussed. Need to monitor blood pressure, cholesterol, and to exercise 30-60 minutes on daily basis was discussed. Poor dental hygiene can be a predisposing factor for rheumatoid arthritis. Good dental hygiene was discussed.    Orders: Orders Placed This Encounter  Procedures  . CBC with Differential/Platelet    . COMPLETE METABOLIC PANEL WITH GFR   Meds ordered this encounter  Medications  . hydroxychloroquine (PLAQUENIL) 200 MG tablet    Sig: Take 1 tablet (200 mg total) by mouth 2 (two) times daily.    Dispense:  60 tablet    Refill:  2      Follow-Up Instructions: Return in about 3 months (around 07/28/2017) for Rheumatoid arthritis.   Ofilia Neas, PA-C  Note - This record has been created using Dragon software.  Chart creation errors have been sought, but may not always  have been located. Such creation errors do not reflect on  the standard of medical care.

## 2017-04-29 ENCOUNTER — Encounter: Payer: Self-pay | Admitting: *Deleted

## 2017-04-29 ENCOUNTER — Ambulatory Visit (INDEPENDENT_AMBULATORY_CARE_PROVIDER_SITE_OTHER): Payer: BLUE CROSS/BLUE SHIELD | Admitting: Rheumatology

## 2017-04-29 ENCOUNTER — Encounter: Payer: Self-pay | Admitting: Rheumatology

## 2017-04-29 VITALS — BP 105/65 | HR 78 | Resp 17 | Ht 67.0 in | Wt 145.0 lb

## 2017-04-29 DIAGNOSIS — Z720 Tobacco use: Secondary | ICD-10-CM

## 2017-04-29 DIAGNOSIS — M0579 Rheumatoid arthritis with rheumatoid factor of multiple sites without organ or systems involvement: Secondary | ICD-10-CM | POA: Diagnosis not present

## 2017-04-29 DIAGNOSIS — Z79899 Other long term (current) drug therapy: Secondary | ICD-10-CM

## 2017-04-29 DIAGNOSIS — Z862 Personal history of diseases of the blood and blood-forming organs and certain disorders involving the immune mechanism: Secondary | ICD-10-CM

## 2017-04-29 MED ORDER — HYDROXYCHLOROQUINE SULFATE 200 MG PO TABS: 200.0000 mg | ORAL_TABLET | Freq: Two times a day (BID) | ORAL | 2 refills | Status: DC

## 2017-04-29 NOTE — Patient Instructions (Signed)
Methotrexate tablets What is this medicine? METHOTREXATE (METH oh TREX ate) is a chemotherapy drug used to treat cancer including breast cancer, leukemia, and lymphoma. This medicine can also be used to treat psoriasis and certain kinds of arthritis. This medicine may be used for other purposes; ask your health care provider or pharmacist if you have questions. COMMON BRAND NAME(S): Rheumatrex, Trexall What should I tell my health care provider before I take this medicine? They need to know if you have any of these conditions: -fluid in the stomach area or lungs -if you often drink alcohol -infection or immune system problems -kidney disease or on hemodialysis -liver disease -low blood counts, like low white cell, platelet, or red cell counts -lung disease -radiation therapy -stomach ulcers -ulcerative colitis -an unusual or allergic reaction to methotrexate, other medicines, foods, dyes, or preservatives -pregnant or trying to get pregnant -breast-feeding How should I use this medicine? Take this medicine by mouth with a glass of water. Follow the directions on the prescription label. Take your medicine at regular intervals. Do not take it more often than directed. Do not stop taking except on your doctor's advice. Make sure you know why you are taking this medicine and how often you should take it. If this medicine is used for a condition that is not cancer, like arthritis or psoriasis, it should be taken weekly, NOT daily. Taking this medicine more often than directed can cause serious side effects, even death. Talk to your healthcare provider about safe handling and disposal of this medicine. You may need to take special precautions. Talk to your pediatrician regarding the use of this medicine in children. While this drug may be prescribed for selected conditions, precautions do apply. Overdosage: If you think you have taken too much of this medicine contact a poison control center or  emergency room at once. NOTE: This medicine is only for you. Do not share this medicine with others. What if I miss a dose? If you miss a dose, talk with your doctor or health care professional. Do not take double or extra doses. What may interact with this medicine? This medicine may interact with the following medication: -acitretin -aspirin and aspirin-like medicines including salicylates -azathioprine -certain antibiotics like penicillins, tetracycline, and chloramphenicol -cyclosporine -gold -hydroxychloroquine -live virus vaccines -NSAIDs, medicines for pain and inflammation, like ibuprofen or naproxen -other cytotoxic agents -penicillamine -phenylbutazone -phenytoin -probenecid -retinoids such as isotretinoin and tretinoin -steroid medicines like prednisone or cortisone -sulfonamides like sulfasalazine and trimethoprim/sulfamethoxazole -theophylline This list may not describe all possible interactions. Give your health care provider a list of all the medicines, herbs, non-prescription drugs, or dietary supplements you use. Also tell them if you smoke, drink alcohol, or use illegal drugs. Some items may interact with your medicine. What should I watch for while using this medicine? Avoid alcoholic drinks. This medicine can make you more sensitive to the sun. Keep out of the sun. If you cannot avoid being in the sun, wear protective clothing and use sunscreen. Do not use sun lamps or tanning beds/booths. You may need blood work done while you are taking this medicine. Call your doctor or health care professional for advice if you get a fever, chills or sore throat, or other symptoms of a cold or flu. Do not treat yourself. This drug decreases your body's ability to fight infections. Try to avoid being around people who are sick. This medicine may increase your risk to bruise or bleed. Call your doctor or health care professional   if you notice any unusual bleeding. Check with your  doctor or health care professional if you get an attack of severe diarrhea, nausea and vomiting, or if you sweat a lot. The loss of too much body fluid can make it dangerous for you to take this medicine. Talk to your doctor about your risk of cancer. You may be more at risk for certain types of cancers if you take this medicine. Both men and women must use effective birth control with this medicine. Do not become pregnant while taking this medicine or until at least 1 normal menstrual cycle has occurred after stopping it. Women should inform their doctor if they wish to become pregnant or think they might be pregnant. Men should not father a child while taking this medicine and for 3 months after stopping it. There is a potential for serious side effects to an unborn child. Talk to your health care professional or pharmacist for more information. Do not breast-feed an infant while taking this medicine. What side effects may I notice from receiving this medicine? Side effects that you should report to your doctor or health care professional as soon as possible: -allergic reactions like skin rash, itching or hives, swelling of the face, lips, or tongue -breathing problems or shortness of breath -diarrhea -dry, nonproductive cough -low blood counts - this medicine may decrease the number of white blood cells, red blood cells and platelets. You may be at increased risk for infections and bleeding. -mouth sores -redness, blistering, peeling or loosening of the skin, including inside the mouth -signs of infection - fever or chills, cough, sore throat, pain or trouble passing urine -signs and symptoms of bleeding such as bloody or black, tarry stools; red or dark-brown urine; spitting up blood or brown material that looks like coffee grounds; red spots on the skin; unusual bruising or bleeding from the eye, gums, or nose -signs and symptoms of kidney injury like trouble passing urine or change in the amount  of urine -signs and symptoms of liver injury like dark yellow or brown urine; general ill feeling or flu-like symptoms; light-colored stools; loss of appetite; nausea; right upper belly pain; unusually weak or tired; yellowing of the eyes or skin Side effects that usually do not require medical attention (report to your doctor or health care professional if they continue or are bothersome): -dizziness -hair loss -tiredness -upset stomach -vomiting This list may not describe all possible side effects. Call your doctor for medical advice about side effects. You may report side effects to FDA at 1-800-FDA-1088. Where should I keep my medicine? Keep out of the reach of children. Store at room temperature between 20 and 25 degrees C (68 and 77 degrees F). Protect from light. Throw away any unused medicine after the expiration date. NOTE: This sheet is a summary. It may not cover all possible information. If you have questions about this medicine, talk to your doctor, pharmacist, or health care provider.  2018 Elsevier/Gold Standard (2014-12-03 05:39:22)  

## 2017-04-29 NOTE — Telephone Encounter (Signed)
Opened in error

## 2017-04-30 NOTE — Progress Notes (Signed)
Hgb and Hct low.  She has a history of anemia.  Labs are stable.   Alk phos continues to be low.  Please add phosphorus to labs.

## 2017-05-03 LAB — CBC WITH DIFFERENTIAL/PLATELET
Basophils Absolute: 31 cells/uL (ref 0–200)
Basophils Relative: 0.8 %
EOS PCT: 2.1 %
Eosinophils Absolute: 82 cells/uL (ref 15–500)
HEMATOCRIT: 33.1 % — AB (ref 35.0–45.0)
Hemoglobin: 10.5 g/dL — ABNORMAL LOW (ref 11.7–15.5)
LYMPHS ABS: 1209 {cells}/uL (ref 850–3900)
MCH: 27.5 pg (ref 27.0–33.0)
MCHC: 31.7 g/dL — ABNORMAL LOW (ref 32.0–36.0)
MCV: 86.6 fL (ref 80.0–100.0)
MPV: 9.8 fL (ref 7.5–12.5)
Monocytes Relative: 11 %
NEUTROS PCT: 55.1 %
Neutro Abs: 2149 cells/uL (ref 1500–7800)
PLATELETS: 337 10*3/uL (ref 140–400)
RBC: 3.82 10*6/uL (ref 3.80–5.10)
RDW: 13.6 % (ref 11.0–15.0)
Total Lymphocyte: 31 %
WBC mixed population: 429 cells/uL (ref 200–950)
WBC: 3.9 10*3/uL (ref 3.8–10.8)

## 2017-05-03 LAB — COMPLETE METABOLIC PANEL WITH GFR
AG Ratio: 2 (calc) (ref 1.0–2.5)
ALT: 5 U/L — AB (ref 6–29)
AST: 11 U/L (ref 10–30)
Albumin: 4.3 g/dL (ref 3.6–5.1)
Alkaline phosphatase (APISO): 32 U/L — ABNORMAL LOW (ref 33–115)
BUN: 9 mg/dL (ref 7–25)
CALCIUM: 9.2 mg/dL (ref 8.6–10.2)
CO2: 28 mmol/L (ref 20–32)
CREATININE: 0.65 mg/dL (ref 0.50–1.10)
Chloride: 103 mmol/L (ref 98–110)
GFR, EST NON AFRICAN AMERICAN: 109 mL/min/{1.73_m2} (ref >=60)
GFR, Est African American: 126 mL/min/{1.73_m2} (ref >=60)
Globulin: 2.2 g/dL (calc) (ref 1.9–3.7)
Glucose, Bld: 73 mg/dL (ref 65–99)
Potassium: 3.7 mmol/L (ref 3.5–5.3)
Sodium: 137 mmol/L (ref 135–146)
Total Bilirubin: 0.3 mg/dL (ref 0.2–1.2)
Total Protein: 6.5 g/dL (ref 6.1–8.1)

## 2017-05-03 LAB — TEST AUTHORIZATION

## 2017-05-03 LAB — PHOSPHORUS: Phosphorus: 3.1 mg/dL (ref 2.5–4.5)

## 2017-05-04 ENCOUNTER — Telehealth: Payer: Self-pay | Admitting: Rheumatology

## 2017-05-04 NOTE — Telephone Encounter (Signed)
Attempted to contact the patient and left message for patient to call the office.  

## 2017-05-04 NOTE — Telephone Encounter (Signed)
Patient left a voicemail returning Andrea's call regarding her lab results.  Her CB# [phone]

## 2017-05-04 NOTE — Progress Notes (Signed)
Phosphorus WNL.

## 2017-05-06 ENCOUNTER — Encounter: Payer: BLUE CROSS/BLUE SHIELD | Admitting: Obstetrics & Gynecology

## 2017-06-14 DIAGNOSIS — A0839 Other viral enteritis: Secondary | ICD-10-CM | POA: Diagnosis not present

## 2017-06-16 ENCOUNTER — Other Ambulatory Visit: Payer: BLUE CROSS/BLUE SHIELD | Admitting: Rheumatology

## 2017-06-30 NOTE — Progress Notes (Deleted)
   Office Visit Note  Patient: Alison Lowery             Date of Birth: 1975-04-21           MRN: [id number]             PCP: [facility] [place] Referring: [facility] [place] Visit Date: 07/13/2017 Occupation: @GUAROCC @    Subjective:  No chief complaint on file.   History of Present Illness: Alison Lowery is a 42 y.o. female ***   Activities of Daily Living:  Patient reports morning stiffness for *** {minute/hour:19697}.   Patient {ACTIONS;DENIES/REPORTS:21021675::"Denies"} nocturnal pain.  Difficulty dressing/grooming: {ACTIONS;DENIES/REPORTS:21021675::"Denies"} Difficulty climbing stairs: {ACTIONS;DENIES/REPORTS:21021675::"Denies"} Difficulty getting out of chair: {ACTIONS;DENIES/REPORTS:21021675::"Denies"} Difficulty using hands for taps, buttons, cutlery, and/or writing: {ACTIONS;DENIES/REPORTS:21021675::"Denies"}   No Rheumatology ROS completed.   PMFS History:  Patient Active Problem List   Diagnosis Date Noted  . Rheumatoid arthritis involving multiple sites with positive rheumatoid factor, positive anti-CCP, +14 33 eta 12/03/2016  . Elevated rheumatoid factor 11/18/2016  . Tobacco abuse 11/18/2016  . History of abnormal electrocardiogram 11/18/2016  . History of anemia 11/18/2016  . History of diarrhea 11/18/2016  . Breast mass, right 10/21/2010    Past Medical History:  Diagnosis Date  . Ectopic pregnancy     No family history on file. No past surgical history on file. Social History   Social History Narrative  . Not on file     Objective: Vital Signs: There were no vitals taken for this visit.   Physical Exam   Musculoskeletal Exam: ***  CDAI Exam: No CDAI exam completed.    Investigation: No additional findings. CBC Latest Ref Rng & Units 04/29/2017 04/08/2017 12/30/2016  WBC 3.8 - 10.8 Thousand/uL 3.9 5.4 4.1  Hemoglobin 11.7 - 15.5 g/dL 10.5(L) 10.4(L) 11.0(L)  Hematocrit 35.0 - 45.0 % 33.1(L) 32.7(L) 34.9(L)  Platelets 140 -  400 Thousand/uL 337 286 282   CMP Latest Ref Rng & Units 04/29/2017 12/30/2016 12/30/2016  Glucose 65 - 99 mg/dL 73 86 -  BUN 7 - 25 mg/dL 9 5(L) -  Creatinine 0.50 - 1.10 mg/dL 0.65 0.80 -  Sodium 135 - 146 mmol/L 137 139 -  Potassium 3.5 - 5.3 mmol/L 3.7 4.1 -  Chloride 98 - 110 mmol/L 103 107 -  CO2 20 - 32 mmol/L 28 26 -  Calcium 8.6 - 10.2 mg/dL 9.2 9.3 -  Total Protein 6.1 - 8.1 g/dL 6.5 6.4 6.4  Total Bilirubin 0.2 - 1.2 mg/dL 0.3 0.3 -  Alkaline Phos 39 - 117 U/L - - -  AST 10 - 30 U/L 11 13 -  ALT 6 - 29 U/L 5(L) 7 -    Imaging: No results found.  Speciality Comments: No specialty comments available.    Procedures:  No procedures performed Allergies: Patient has no known allergies.   Assessment / Plan:     Visit Diagnoses: No diagnosis found.    Orders: No orders of the defined types were placed in this encounter.  No orders of the defined types were placed in this encounter.   Face-to-face time spent with patient was *** minutes. 50% of time was spent in counseling and coordination of care.  Follow-Up Instructions: No Follow-up on file.   Earnestine Mealing, CMA  Note - This record has been created using Editor, commissioning.  Chart creation errors have been sought, but may not always  have been located. Such creation errors do not reflect on  the standard of medical care.

## 2017-07-02 ENCOUNTER — Encounter (HOSPITAL_COMMUNITY): Payer: Self-pay | Admitting: *Deleted

## 2017-07-02 ENCOUNTER — Other Ambulatory Visit: Payer: Self-pay

## 2017-07-02 ENCOUNTER — Emergency Department (HOSPITAL_COMMUNITY)
Admission: EM | Admit: 2017-07-02 | Discharge: 2017-07-02 | Disposition: A | Discharge status: Home / Self Care | Payer: BLUE CROSS/BLUE SHIELD | Attending: Emergency Medicine | Admitting: Emergency Medicine

## 2017-07-02 DIAGNOSIS — R0981 Nasal congestion: Secondary | ICD-10-CM | POA: Diagnosis not present

## 2017-07-02 DIAGNOSIS — J301 Allergic rhinitis due to pollen: Secondary | ICD-10-CM | POA: Insufficient documentation

## 2017-07-02 DIAGNOSIS — R05 Cough: Secondary | ICD-10-CM | POA: Diagnosis not present

## 2017-07-02 DIAGNOSIS — F17210 Nicotine dependence, cigarettes, uncomplicated: Secondary | ICD-10-CM | POA: Diagnosis not present

## 2017-07-02 DIAGNOSIS — J302 Other seasonal allergic rhinitis: Secondary | ICD-10-CM | POA: Diagnosis not present

## 2017-07-02 DIAGNOSIS — Z79899 Other long term (current) drug therapy: Secondary | ICD-10-CM | POA: Insufficient documentation

## 2017-07-02 MED ORDER — BENZONATATE 100 MG PO CAPS: 100.0000 mg | ORAL_CAPSULE | Freq: Three times a day (TID) | ORAL | 0 refills | Status: DC

## 2017-07-02 MED ORDER — FLUTICASONE PROPIONATE 50 MCG/ACT NA SUSP: 1.0000 | Freq: Every day | NASAL | 2 refills | Status: DC

## 2017-07-02 NOTE — ED Provider Notes (Signed)
[facility] EMERGENCY DEPARTMENT Provider Note   CSN: [id number] Arrival date & time: 07/02/17  1559     History   Chief Complaint Chief Complaint  Patient presents with  . Cough    HPI Alison Lowery is a 42 y.o. female.  HPI   42 year old female with history of rheumatoid arthritis, tobacco abuse, anemia presenting complaining of cold symptoms.  For the past 6 days patient report having sinus congestion, runny nose, nonproductive cough, sneezing, throat irritation as well as having occasional sweating.  She has been trying over-the-counter medication including Claritin without adequate relief.  She denies having fever, severe headache, shortness of breath, exertional chest pain, nausea vomiting diarrhea, abdominal pain or back pain.  She does complain of pain to the right side of the chest from persistent coughing.  No prior history of PE or DVT, no recent surgery, prolonged bed rest, unilateral swelling or calf pain, active cancer or hemoptysis.  She is not on any oral hormone.  She is a smoker.  No recent sick contact.  Past Medical History:  Diagnosis Date  . Ectopic pregnancy     Patient Active Problem List   Diagnosis Date Noted  . Rheumatoid arthritis involving multiple sites with positive rheumatoid factor, positive anti-CCP, +14 33 eta 12/03/2016  . Elevated rheumatoid factor 11/18/2016  . Tobacco abuse 11/18/2016  . History of abnormal electrocardiogram 11/18/2016  . History of anemia 11/18/2016  . History of diarrhea 11/18/2016  . Breast mass, right 10/21/2010    History reviewed. No pertinent surgical history.   OB History   None      Home Medications    Prior to Admission medications   Medication Sig Start Date End Date Taking? Authorizing Provider  hydroxychloroquine (PLAQUENIL) 200 MG tablet Take 1 tablet (200 mg total) by mouth 2 (two) times daily. 04/29/17   Ofilia Neas, PA-C    Family History History reviewed. No pertinent  family history.  Social History Social History   Tobacco Use  . Smoking status: Current Every Day Smoker    Packs/day: 1.00    Types: Cigarettes  . Smokeless tobacco: Never Used  Substance Use Topics  . Alcohol use: Yes    Comment: occ  . Drug use: No     Allergies   Patient has no known allergies.   Review of Systems Review of Systems  All other systems reviewed and are negative.    Physical Exam Updated Vital Signs BP 113/76 (BP Location: Right Arm)   Pulse 98   Temp 98.7 F (37.1 C) (Oral)   Resp 18   LMP 06/11/2017   SpO2 99%   Physical Exam  Constitutional: She is oriented to person, place, and time. She appears well-developed and well-nourished. No distress.  HENT:  Head: Atraumatic.  Ears: TMs normal bilaterally Nose: Rhinorrhea Throat: Uvula midline no tonsillar enlargement or exudates  Eyes: Pupils are equal, round, and reactive to light. Conjunctivae and EOM are normal.  Neck: Normal range of motion. Neck supple. No tracheal deviation present. No thyromegaly present.  Cardiovascular: Normal rate and regular rhythm.  Pulmonary/Chest: Effort normal and breath sounds normal. She has no wheezes. She has no rales.  Abdominal: Soft. She exhibits no distension. There is no tenderness.  Lymphadenopathy:    She has no cervical adenopathy.  Neurological: She is alert and oriented to person, place, and time.  Skin: No rash noted.  Psychiatric: She has a normal mood and affect.  Nursing note and vitals  reviewed.    ED Treatments / Results  Labs (all labs ordered are listed, but only abnormal results are displayed) Labs Reviewed - No data to display  EKG None  Radiology No results found.  Procedures Procedures (including critical care time)  Medications Ordered in ED Medications - No data to display   Initial Impression / Assessment and Plan / ED Course  I have reviewed the triage vital signs and the nursing notes.  Pertinent labs & imaging  results that were available during my care of the patient were reviewed by me and considered in my medical decision making (see chart for details).     BP 113/76 (BP Location: Right Arm)   Pulse 98   Temp 98.7 F (37.1 C) (Oral)   Resp 18   LMP 06/11/2017   SpO2 99%    Final Clinical Impressions(s) / ED Diagnoses   Final diagnoses:  Seasonal allergies    ED Discharge Orders        Ordered    benzonatate (TESSALON) 100 MG capsule  Every 8 hours     07/02/17 1718    fluticasone (FLONASE) 50 MCG/ACT nasal spray  Daily     07/02/17 1718     5:16 PM Patient here with cold symptoms.  Symptoms also suggestive of seasonal allergies.  Will provide cough medication as well as Flonase and discussed appropriate use.  I have low suspicion for pneumonia as patient is afebrile, no hypoxia and lung exam unremarkable.  She is PERC negative, doubt PE.  DOubt ACS.    Domenic Moras, PA-C 07/02/17 1718    Drenda Freeze, MD 07/02/17 [id number]

## 2017-07-02 NOTE — ED Triage Notes (Signed)
Pt reports having a cough and headache, chills x 1 week. No acute distress noted at triage.

## 2017-07-05 DIAGNOSIS — R05 Cough: Secondary | ICD-10-CM | POA: Insufficient documentation

## 2017-07-05 DIAGNOSIS — R0602 Shortness of breath: Secondary | ICD-10-CM | POA: Diagnosis not present

## 2017-07-05 DIAGNOSIS — Z5321 Procedure and treatment not carried out due to patient leaving prior to being seen by health care provider: Secondary | ICD-10-CM | POA: Insufficient documentation

## 2017-07-06 ENCOUNTER — Emergency Department (HOSPITAL_COMMUNITY): Payer: BLUE CROSS/BLUE SHIELD

## 2017-07-06 ENCOUNTER — Emergency Department (HOSPITAL_COMMUNITY)
Admission: EM | Admit: 2017-07-06 | Discharge: 2017-07-06 | Disposition: A | Discharge status: Home / Self Care | Payer: BLUE CROSS/BLUE SHIELD | Attending: Emergency Medicine | Admitting: Emergency Medicine

## 2017-07-06 ENCOUNTER — Encounter (HOSPITAL_COMMUNITY): Payer: Self-pay | Admitting: Emergency Medicine

## 2017-07-06 DIAGNOSIS — R0602 Shortness of breath: Secondary | ICD-10-CM | POA: Diagnosis not present

## 2017-07-06 DIAGNOSIS — R05 Cough: Secondary | ICD-10-CM | POA: Diagnosis not present

## 2017-07-06 NOTE — ED Notes (Signed)
No answer for vitals check

## 2017-07-06 NOTE — ED Triage Notes (Signed)
Pt reports cough X2 weeks, pt was seen here this past Friday and given tessalon, states she has had no relief. Dry cough. Denies SOB

## 2017-07-13 ENCOUNTER — Ambulatory Visit: Payer: BLUE CROSS/BLUE SHIELD | Admitting: Rheumatology

## 2017-07-18 ENCOUNTER — Encounter (HOSPITAL_COMMUNITY): Payer: Self-pay | Admitting: Emergency Medicine

## 2017-07-18 DIAGNOSIS — Z5321 Procedure and treatment not carried out due to patient leaving prior to being seen by health care provider: Secondary | ICD-10-CM | POA: Diagnosis not present

## 2017-07-18 DIAGNOSIS — M25562 Pain in left knee: Secondary | ICD-10-CM | POA: Diagnosis not present

## 2017-07-18 DIAGNOSIS — M545 Low back pain: Secondary | ICD-10-CM | POA: Insufficient documentation

## 2017-07-18 NOTE — ED Notes (Signed)
Patient called for room placement x1 with no answer. 

## 2017-07-18 NOTE — ED Notes (Signed)
Writer called for room assignment, no response 
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
## 2017-07-18 NOTE — ED Notes (Signed)
Bed: WA07 Expected date:  Expected time:  Means of arrival:  Comments: 

## 2017-07-19 ENCOUNTER — Emergency Department (HOSPITAL_COMMUNITY)
Admission: EM | Admit: 2017-07-19 | Discharge: 2017-07-19 | Disposition: A | Discharge status: Home / Self Care | Payer: BLUE CROSS/BLUE SHIELD | Attending: Emergency Medicine | Admitting: Emergency Medicine

## 2017-07-19 NOTE — ED Notes (Signed)
Writer called for room assignment, no response 

## 2017-11-14 ENCOUNTER — Other Ambulatory Visit: Payer: Self-pay

## 2017-11-14 ENCOUNTER — Ambulatory Visit (INDEPENDENT_AMBULATORY_CARE_PROVIDER_SITE_OTHER): Payer: No Typology Code available for payment source

## 2017-11-14 ENCOUNTER — Ambulatory Visit (HOSPITAL_COMMUNITY)
Admission: EM | Admit: 2017-11-14 | Discharge: 2017-11-14 | Disposition: A | Discharge status: Home / Self Care | Payer: No Typology Code available for payment source | Attending: Family Medicine | Admitting: Family Medicine

## 2017-11-14 ENCOUNTER — Encounter (HOSPITAL_COMMUNITY): Payer: Self-pay | Admitting: Emergency Medicine

## 2017-11-14 DIAGNOSIS — S29019A Strain of muscle and tendon of unspecified wall of thorax, initial encounter: Secondary | ICD-10-CM

## 2017-11-14 DIAGNOSIS — T148XXA Other injury of unspecified body region, initial encounter: Secondary | ICD-10-CM

## 2017-11-14 DIAGNOSIS — M546 Pain in thoracic spine: Secondary | ICD-10-CM | POA: Diagnosis not present

## 2017-11-14 DIAGNOSIS — M5416 Radiculopathy, lumbar region: Secondary | ICD-10-CM | POA: Diagnosis not present

## 2017-11-14 HISTORY — DX: Unspecified osteoarthritis, unspecified site: M19.90

## 2017-11-14 MED ORDER — CYCLOBENZAPRINE HCL 5 MG PO TABS: 5.0000 mg | ORAL_TABLET | Freq: Three times a day (TID) | ORAL | 0 refills | Status: DC | PRN

## 2017-11-14 MED ORDER — NAPROXEN 500 MG PO TABS: 500.0000 mg | ORAL_TABLET | Freq: Two times a day (BID) | ORAL | 0 refills | Status: DC
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
## 2017-11-14 NOTE — Discharge Instructions (Signed)
Take Naprosyn twice a day with food This is an anti-inflammatory pain medication Take the Flexeril 3 times a day as needed as a muscle relaxer This is especially helpful at bedtime. It may cause drowsiness during the day Use ice or heat to painful back muscles. Consider physical therapy if fails to improve.
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
## 2017-11-15 ENCOUNTER — Telehealth (HOSPITAL_COMMUNITY): Payer: Self-pay
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
## 2017-11-25 ENCOUNTER — Ambulatory Visit (INDEPENDENT_AMBULATORY_CARE_PROVIDER_SITE_OTHER): Payer: BLUE CROSS/BLUE SHIELD | Admitting: Rheumatology

## 2017-11-25 ENCOUNTER — Encounter: Payer: Self-pay | Admitting: Rheumatology

## 2017-11-25 VITALS — BP 106/62 | HR 83 | Resp 14 | Ht 67.5 in | Wt 147.6 lb

## 2017-11-25 DIAGNOSIS — Z79899 Other long term (current) drug therapy: Secondary | ICD-10-CM

## 2017-11-25 DIAGNOSIS — M0579 Rheumatoid arthritis with rheumatoid factor of multiple sites without organ or systems involvement: Secondary | ICD-10-CM

## 2017-11-25 DIAGNOSIS — Z862 Personal history of diseases of the blood and blood-forming organs and certain disorders involving the immune mechanism: Secondary | ICD-10-CM | POA: Diagnosis not present

## 2017-11-25 DIAGNOSIS — Z720 Tobacco use: Secondary | ICD-10-CM

## 2017-11-25 MED ORDER — HYDROXYCHLOROQUINE SULFATE 200 MG PO TABS: ORAL_TABLET | ORAL | 0 refills | Status: DC

## 2017-11-26 LAB — COMPLETE METABOLIC PANEL WITH GFR
AG Ratio: 1.5 (calc) (ref 1.0–2.5)
ALBUMIN MSPROF: 3.9 g/dL (ref 3.6–5.1)
ALT: 7 U/L (ref 6–29)
AST: 13 U/L (ref 10–30)
Alkaline phosphatase (APISO): 37 U/L (ref 33–115)
BUN: 9 mg/dL (ref 7–25)
CALCIUM: 9 mg/dL (ref 8.6–10.2)
CO2: 24 mmol/L (ref 20–32)
CREATININE: 0.66 mg/dL (ref 0.50–1.10)
Chloride: 107 mmol/L (ref 98–110)
GFR, EST NON AFRICAN AMERICAN: 107 mL/min/{1.73_m2} (ref >=60)
GFR, Est African American: 125 mL/min/{1.73_m2} (ref >=60)
GLOBULIN: 2.6 g/dL (ref 1.9–3.7)
Glucose, Bld: 73 mg/dL (ref 65–99)
Potassium: 3.8 mmol/L (ref 3.5–5.3)
SODIUM: 138 mmol/L (ref 135–146)
Total Bilirubin: 0.3 mg/dL (ref 0.2–1.2)
Total Protein: 6.5 g/dL (ref 6.1–8.1)

## 2017-11-26 LAB — CBC WITH DIFFERENTIAL/PLATELET
BASOS PCT: 0.8 %
Basophils Absolute: 39 cells/uL (ref 0–200)
EOS PCT: 1 %
Eosinophils Absolute: 49 cells/uL (ref 15–500)
HCT: 28.6 % — ABNORMAL LOW (ref 35.0–45.0)
HEMOGLOBIN: 8.4 g/dL — AB (ref 11.7–15.5)
LYMPHS ABS: 1112 {cells}/uL (ref 850–3900)
MCH: 21.9 pg — ABNORMAL LOW (ref 27.0–33.0)
MCHC: 29.4 g/dL — ABNORMAL LOW (ref 32.0–36.0)
MCV: 74.5 fL — ABNORMAL LOW (ref 80.0–100.0)
MPV: 9.6 fL (ref 7.5–12.5)
Monocytes Relative: 11.7 %
NEUTROS ABS: 3126 {cells}/uL (ref 1500–7800)
Neutrophils Relative %: 63.8 %
Platelets: 345 10*3/uL (ref 140–400)
RBC: 3.84 10*6/uL (ref 3.80–5.10)
RDW: 16.5 % — ABNORMAL HIGH (ref 11.0–15.0)
Total Lymphocyte: 22.7 %
WBC mixed population: 573 cells/uL (ref 200–950)
WBC: 4.9 10*3/uL (ref 3.8–10.8)

## 2017-11-26 NOTE — Progress Notes (Signed)
Hb has dropped. Please notify Pt and her PCP. She should avoid all NSAIDS

## 2017-11-30 ENCOUNTER — Telehealth: Payer: Self-pay | Admitting: Rheumatology

## 2017-11-30 NOTE — Telephone Encounter (Signed)
Patient called checking on her FMLA paperwork.

## 2017-12-03 ENCOUNTER — Telehealth: Payer: Self-pay | Admitting: *Deleted

## 2017-12-03 NOTE — Telephone Encounter (Signed)
Patient advised we will be unable to fill out FMLA paperwork as we have not done so before. Patient advised she will need to have this done by her PCP. Patient would like a refund for the money she paid for the paperwork to be filled out. Patient stated she "didn't have time to deal with this" and she then hung up the phone.

## 2018-02-14 NOTE — Progress Notes (Deleted)
Office Visit Note  Patient: Alison Lowery             Date of Birth: 1975-09-17           MRN: [id number]             PCP: [facility] [place] Referring: [facility] [place] Visit Date: 02/25/2018 Occupation: @GUAROCC @  Subjective:  No chief complaint on file.  Current regimen includes 200 mg po twice daily M-F.  No baseline PLQ eye exam on file.  Most recent CBC/CMP within normal limits except drop in hemoglobin on 11/25/17.  Results forwarded to PCP and advised to avoid NSAID's.  Recommend annual flu and Pneumovax 23 vaccine as indicated.   Last x-rays of left knee and bilateral hands/feet on 11/25/16.  History of Present Illness: Alison Lowery is a 42 y.o. female with history of seropositive rheumatoid arthritis.  Activities of Daily Living:  Patient reports morning stiffness for *** {minute/hour:19697}.   Patient {ACTIONS;DENIES/REPORTS:21021675::"Denies"} nocturnal pain.  Difficulty dressing/grooming: {ACTIONS;DENIES/REPORTS:21021675::"Denies"} Difficulty climbing stairs: {ACTIONS;DENIES/REPORTS:21021675::"Denies"} Difficulty getting out of chair: {ACTIONS;DENIES/REPORTS:21021675::"Denies"} Difficulty using hands for taps, buttons, cutlery, and/or writing: {ACTIONS;DENIES/REPORTS:21021675::"Denies"}  No Rheumatology ROS completed.   PMFS History:  Patient Active Problem List   Diagnosis Date Noted  . Rheumatoid arthritis involving multiple sites with positive rheumatoid factor, positive anti-CCP, +14 33 eta 12/03/2016  . Elevated rheumatoid factor 11/18/2016  . Tobacco abuse 11/18/2016  . History of abnormal electrocardiogram 11/18/2016  . History of anemia 11/18/2016  . History of diarrhea 11/18/2016  . Breast mass, right 10/21/2010    Past Medical History:  Diagnosis Date  . Arthritis   . Ectopic pregnancy   . Rheumatoid arthritis ([place])     No family history on file. No past surgical history on file. Social History   Social History Narrative  . Not on  file    Objective: Vital Signs: There were no vitals taken for this visit.   Physical Exam   Musculoskeletal Exam: ***  CDAI Exam: CDAI Score: Not documented Patient Global Assessment: Not documented; Provider Global Assessment: Not documented Swollen: Not documented; Tender: Not documented Joint Exam   Not documented   There is currently no information documented on the homunculus. Go to the Rheumatology activity and complete the homunculus joint exam.  Investigation: No additional findings.  Imaging: No results found.  Recent Labs: Lab Results  Component Value Date   WBC 4.9 11/25/2017   HGB 8.4 (L) 11/25/2017   PLT 345 11/25/2017   NA 138 11/25/2017   K 3.8 11/25/2017   CL 107 11/25/2017   CO2 24 11/25/2017   GLUCOSE 73 11/25/2017   BUN 9 11/25/2017   CREATININE 0.66 11/25/2017   BILITOT 0.3 11/25/2017   ALKPHOS 34 (L) 02/21/2013   AST 13 11/25/2017   ALT 7 11/25/2017   PROT 6.5 11/25/2017   ALBUMIN 3.8 02/21/2013   CALCIUM 9.0 11/25/2017   GFRAA 125 11/25/2017   QFTBGOLD NEGATIVE 12/30/2016    Speciality Comments: No specialty comments available.  Procedures:  No procedures performed Allergies: Patient has no known allergies.   Assessment / Plan:     Visit Diagnoses: Rheumatoid arthritis involving multiple sites with positive rheumatoid factor, positive anti-CCP, +14 33 eta  High risk medication use  History of anemia  Tobacco abuse   Orders: No orders of the defined types were placed in this encounter.  No orders of the defined types were placed in this encounter.   Face-to-face time spent with patient was *** minutes.  Greater than 50% of time was spent in counseling and coordination of care.  Follow-Up Instructions: No follow-ups on file.   Ofilia Neas, PA-C  Note - This record has been created using Dragon software.  Chart creation errors have been sought, but may not always  have been located. Such creation errors do not  reflect on  the standard of medical care.

## 2018-02-25 ENCOUNTER — Ambulatory Visit: Payer: BLUE CROSS/BLUE SHIELD | Admitting: Physician Assistant

## 2018-08-24 ENCOUNTER — Other Ambulatory Visit: Payer: Self-pay

## 2018-08-24 ENCOUNTER — Ambulatory Visit (INDEPENDENT_AMBULATORY_CARE_PROVIDER_SITE_OTHER): Payer: BLUE CROSS/BLUE SHIELD | Admitting: Registered Nurse

## 2018-08-24 ENCOUNTER — Encounter: Payer: Self-pay | Admitting: Registered Nurse

## 2018-08-24 VITALS — BP 115/70 | HR 77 | Temp 99.0°F | Resp 16 | Ht 67.0 in | Wt 152.0 lb

## 2018-08-24 DIAGNOSIS — Z1329 Encounter for screening for other suspected endocrine disorder: Secondary | ICD-10-CM | POA: Diagnosis not present

## 2018-08-24 DIAGNOSIS — M0579 Rheumatoid arthritis with rheumatoid factor of multiple sites without organ or systems involvement: Secondary | ICD-10-CM

## 2018-08-24 DIAGNOSIS — Z716 Tobacco abuse counseling: Secondary | ICD-10-CM

## 2018-08-24 DIAGNOSIS — Z7689 Persons encountering health services in other specified circumstances: Secondary | ICD-10-CM

## 2018-08-24 MED ORDER — NICOTINE POLACRILEX 4 MG MT GUM: 4.0000 mg | CHEWING_GUM | OROMUCOSAL | 0 refills | Status: DC | PRN
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
## 2018-08-24 NOTE — Patient Instructions (Signed)
° ° ° °  If you have lab work done today you will be contacted with your lab results within the next 2 weeks.  If you have not heard from us then please contact us. The fastest way to get your results is to register for My Chart. ° ° °IF you received an x-ray today, you will receive an invoice from [facility]. Please contact [facility] at [phone] with questions or concerns regarding your invoice.  ° °IF you received labwork today, you will receive an invoice from [facility]. Please contact [facility] at [phone] with questions or concerns regarding your invoice.  ° °Our billing staff will not be able to assist you with questions regarding bills from these companies. ° °You will be contacted with the lab results as soon as they are available. The fastest way to get your results is to activate your My Chart account. Instructions are located on the last page of this paperwork. If you have not heard from us regarding the results in 2 weeks, please contact this office. °  ° ° ° °

## 2018-08-25 LAB — COMPREHENSIVE METABOLIC PANEL
ALT: 6 IU/L (ref 0–32)
AST: 12 IU/L (ref 0–40)
Albumin/Globulin Ratio: 2.1 (ref 1.2–2.2)
Albumin: 4.6 g/dL (ref 3.8–4.8)
Alkaline Phosphatase: 42 IU/L (ref 39–117)
BUN/Creatinine Ratio: 13 (ref 9–23)
BUN: 8 mg/dL (ref 6–24)
Bilirubin Total: <0.2 mg/dL (ref 0.0–1.2)
CO2: 22 mmol/L (ref 20–29)
Calcium: 9.6 mg/dL (ref 8.7–10.2)
Chloride: 106 mmol/L (ref 96–106)
Creatinine, Ser: 0.6 mg/dL (ref 0.57–1.00)
GFR calc Af Amer: 128 mL/min/{1.73_m2} (ref >59)
GFR calc non Af Amer: 111 mL/min/{1.73_m2} (ref >59)
Globulin, Total: 2.2 g/dL (ref 1.5–4.5)
Glucose: 95 mg/dL (ref 65–99)
Potassium: 4.6 mmol/L (ref 3.5–5.2)
Sodium: 137 mmol/L (ref 134–144)
Total Protein: 6.8 g/dL (ref 6.0–8.5)

## 2018-08-25 LAB — TSH: TSH: 0.91 u[IU]/mL (ref 0.450–4.500)

## 2018-08-25 LAB — CBC WITH DIFFERENTIAL/PLATELET
Basophils Absolute: 0.1 10*3/uL (ref 0.0–0.2)
Basos: 1 %
EOS (ABSOLUTE): 0.1 10*3/uL (ref 0.0–0.4)
Eos: 1 %
Hematocrit: 30.2 % — ABNORMAL LOW (ref 34.0–46.6)
Hemoglobin: 9.2 g/dL — ABNORMAL LOW (ref 11.1–15.9)
Immature Grans (Abs): 0 10*3/uL (ref 0.0–0.1)
Immature Granulocytes: 0 %
Lymphocytes Absolute: 1.8 10*3/uL (ref 0.7–3.1)
Lymphs: 31 %
MCH: 22.9 pg — ABNORMAL LOW (ref 26.6–33.0)
MCHC: 30.5 g/dL — ABNORMAL LOW (ref 31.5–35.7)
MCV: 75 fL — ABNORMAL LOW (ref 79–97)
Monocytes Absolute: 0.5 10*3/uL (ref 0.1–0.9)
Monocytes: 8 %
Neutrophils Absolute: 3.4 10*3/uL (ref 1.4–7.0)
Neutrophils: 59 %
Platelets: 366 10*3/uL (ref 150–450)
RBC: 4.01 x10E6/uL (ref 3.77–5.28)
RDW: 17.1 % — ABNORMAL HIGH (ref 11.7–15.4)
WBC: 5.8 10*3/uL (ref 3.4–10.8)

## 2018-08-25 LAB — LIPID PANEL
Chol/HDL Ratio: 2.4 ratio (ref 0.0–4.4)
Cholesterol, Total: 143 mg/dL (ref 100–199)
HDL: 59 mg/dL (ref >39)
LDL Calculated: 66 mg/dL (ref 0–99)
Triglycerides: 89 mg/dL (ref 0–149)
VLDL Cholesterol Cal: 18 mg/dL (ref 5–40)

## 2018-08-26 ENCOUNTER — Other Ambulatory Visit: Payer: Self-pay | Admitting: Registered Nurse

## 2018-08-26 DIAGNOSIS — D649 Anemia, unspecified: Secondary | ICD-10-CM

## 2018-08-26 NOTE — Progress Notes (Signed)
Pt CBC returned anemic - hgb 9.2, hematocrit 30 - pt needs to return for iron studies.  Pt denied sxs of anemia and sxs of GI bleed, has decreased NSAID use, so this is likely IDA d/t chronic inflammation of RA, however, need to follow up with iron studies.

## 2018-08-26 NOTE — Progress Notes (Signed)
Pt called and informed of labs, she verbalized understanding.

## 2018-08-30 ENCOUNTER — Ambulatory Visit: Payer: Self-pay | Admitting: Family Medicine

## 2018-09-27 ENCOUNTER — Encounter: Payer: BLUE CROSS/BLUE SHIELD | Admitting: Family Medicine

## 2018-11-21 ENCOUNTER — Ambulatory Visit: Payer: BLUE CROSS/BLUE SHIELD | Admitting: Registered Nurse

## 2018-11-22 ENCOUNTER — Encounter: Payer: Self-pay | Admitting: Registered Nurse

## 2018-12-22 DIAGNOSIS — N760 Acute vaginitis: Secondary | ICD-10-CM | POA: Diagnosis not present

## 2019-02-23 NOTE — Progress Notes (Signed)
Office Visit Note  Patient: Alison Lowery             Date of Birth: 04-Mar-1976           MRN: [id number]             PCP: Maximiano Coss, NP Referring: Maximiano Coss, NP Visit Date: 02/27/2019 Occupation: @GUAROCC @  Subjective:  Discuss medications   History of Present Illness: Alison Lowery is a 43 y.o. female with history of seropositive rheumatoid arthritis.  She is not taking any immunosuppressive agents at this time.  She did not start taking Plaquenil due to being apprehensive of side effects in the past.  She states that she has intermittent pain in the left second toe and has noticed some swelling.  She denies any other joint pain or joint swelling at this time.  She states that she experiences some morning stiffness in both hands on occasion but typically does not have any joint stiffness.  Activities of Daily Living:  Patient reports morning stiffness for 0 minutes.   Patient Denies nocturnal pain.  Difficulty dressing/grooming: Denies Difficulty climbing stairs: Denies Difficulty getting out of chair: Denies Difficulty using hands for taps, buttons, cutlery, and/or writing: Denies  Review of Systems  Constitutional: Negative for fatigue.  HENT: Negative for mouth sores, mouth dryness and nose dryness.   Eyes: Negative for pain, itching, visual disturbance and dryness.  Respiratory: Negative for cough, hemoptysis, shortness of breath, wheezing and difficulty breathing.   Cardiovascular: Negative for chest pain, palpitations, hypertension and swelling in legs/feet.  Gastrointestinal: Negative for blood in stool, constipation and diarrhea.  Endocrine: Negative for increased urination.  Genitourinary: Negative for difficulty urinating and painful urination.  Musculoskeletal: Positive for arthralgias, joint pain and joint swelling. Negative for myalgias, muscle weakness, morning stiffness, muscle tenderness and myalgias.  Skin: Negative for color change, pallor, rash,  hair loss, nodules/bumps, skin tightness, ulcers and sensitivity to sunlight.  Allergic/Immunologic: Negative for susceptible to infections.  Neurological: Negative for dizziness, light-headedness, numbness, headaches, memory loss and weakness.  Hematological: Negative for swollen glands.  Psychiatric/Behavioral: Negative for depressed mood, confusion and sleep disturbance. The patient is not nervous/anxious.     PMFS History:  Patient Active Problem List   Diagnosis Date Noted   Rheumatoid arthritis involving multiple sites with positive rheumatoid factor, positive anti-CCP, +14 33 eta 12/03/2016   Elevated rheumatoid factor 11/18/2016   Tobacco abuse 11/18/2016   History of abnormal electrocardiogram 11/18/2016   History of anemia 11/18/2016   History of diarrhea 11/18/2016   Breast mass, right 10/21/2010    Past Medical History:  Diagnosis Date   Arthritis    Ectopic pregnancy    Rheumatoid arthritis ([place])     History reviewed. No pertinent family history. History reviewed. No pertinent surgical history. Social History   Social History Narrative   Not on file    There is no immunization history on file for this patient.   Objective: Vital Signs: BP 119/79 (BP Location: Left Arm, Patient Position: Sitting, Cuff Size: Normal)    Pulse 95    Resp 12    Ht 5\' 7"  (1.702 m)    Wt 151 lb 12.8 oz (68.9 kg)    BMI 23.78 kg/m    Physical Exam Vitals signs and nursing note reviewed.  Constitutional:      Appearance: She is well-developed.  HENT:     Head: Normocephalic and atraumatic.  Eyes:     Conjunctiva/sclera: Conjunctivae normal.  Neck:  Musculoskeletal: Normal range of motion.  Cardiovascular:     Rate and Rhythm: Normal rate and regular rhythm.     Heart sounds: Normal heart sounds.  Pulmonary:     Effort: Pulmonary effort is normal.     Breath sounds: Normal breath sounds.  Abdominal:     General: Bowel sounds are normal.     Palpations: Abdomen  is soft.  Lymphadenopathy:     Cervical: No cervical adenopathy.  Skin:    General: Skin is warm and dry.     Capillary Refill: Capillary refill takes less than 2 seconds.  Neurological:     Mental Status: She is alert and oriented to person, place, and time.  Psychiatric:        Behavior: Behavior normal.      Musculoskeletal Exam: C-spine, thoracic spine, lumbar spine good range of motion.  No midline spinal tenderness.  No SI joint tenderness.  Shoulder joints, elbow joints, wrist joints, MCPs, PIPs, DIPs good range of motion no synovitis.  She has complete fist formation bilaterally.  Hip joints have good range of motion with no discomfort.  No tenderness over trochanteric bursa bilaterally.  Knee joints have good range of motion with no warmth or effusion.  Ankle joints have good range of motion with no tenderness or inflammation.  She has tenderness of the left second MTP and PIP joint.  CDAI Exam: CDAI Score: 0.6  Patient Global: 3 mm; Provider Global: 3 mm Swollen: 0 ; Tender: 2  Joint Exam      Right  Left  MTP 2      Tender  PIP 2 (toe)      Tender     Investigation: No additional findings.  Imaging: Xr Foot 2 Views Left  Result Date: 02/27/2019 PIP and DIP narrowing was noted.  No erosive changes were noted.  No intertarsal or tibiotalar joint space narrowing was noted.  Juxta-articular osteopenia was noted. 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  Xr Foot 2 Views Right  Result Date: 02/27/2019 First MTP, PIP and DIP narrowing was noted.  An erosion was noted over the fifth metatarsal head.  No intertarsal or tibiotalar joint space narrowing was noted.  Juxta-articular osteopenia was noted. 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  Xr Hand 2 View Left  Result Date: 02/27/2019 PIP and DIP narrowing was noted.  No erosive changes were noted.  No MCP, intercarpal  radiocarpal joint space narrowing was noted.  Juxta-articular osteopenia was noted. 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  No erosive changes were noted.  No MCP, intercarpal radiocarpal joint space narrowing was noted.  Juxta-articular osteopenia was noted. 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   Recent Labs: Lab Results  Component Value Date   WBC 5.8 08/24/2018   HGB 9.2 (L) 08/24/2018   PLT 366 08/24/2018   NA 137 08/24/2018   K 4.6 08/24/2018   CL 106 08/24/2018   CO2 22 08/24/2018   GLUCOSE 95 08/24/2018   BUN 8 08/24/2018   CREATININE 0.60 08/24/2018   BILITOT <0.2 08/24/2018   ALKPHOS 42 08/24/2018   AST 12 08/24/2018   ALT 6 08/24/2018   PROT 6.8 08/24/2018   ALBUMIN 4.6 08/24/2018   CALCIUM 9.6 08/24/2018   GFRAA 128 08/24/2018   QFTBGOLD NEGATIVE  12/30/2016    Speciality Comments: No specialty comments available.  Procedures:  No procedures performed Allergies: Patient has no known allergies.   Assessment / Plan:     Visit Diagnoses: Rheumatoid arthritis involving multiple sites with positive rheumatoid factor, positive anti-CCP, +14 33 eta - Erosion right 5th MTP: She has no active synovitis on exam today.  She has tenderness of the left second MTP and PIP joint.  She has been having intermittent pain and swelling in both feet.  She has no other joint tenderness or synovitis on exam.  She is not taking any immunosuppressive agents at this time.  She was last seen in the office on 11/25/2017.  She has been concerned about underlying joint damage since she did not start on Plaquenil due to the concern for potential side effects.  We discussed the disease process of rheumatoid arthritis.  We also reviewed x-rays, ultrasound results, and lab work which was previously obtained.   All questions were addressed.  We discussed the importance of starting on Plaquenil.  Indications, contraindications, potential side effects of Plaquenil were discussed.  All questions were addressed and consent was obtained today.  X-rays of both hands and feet were obtained today to assess for interval change.  An erosion was noted in the right fifth metatarsal head.  Ideally she should be on more aggressive treatment.  We will see her response to Plaquenil.  She will follow-up in the office in 6 weeks.  Plan: XR Hand 2 View Right, XR Hand 2 View Left, XR Foot 2 Views Right, XR Foot 2 Views Left  Patient was counseled on the purpose, proper use, and adverse effects of hydroxychloroquine including nausea/diarrhea, skin rash, headaches, and sun sensitivity.  Discussed importance of annual eye exams while on hydroxychloroquine to monitor to ocular toxicity and discussed importance of frequent laboratory monitoring.  Provided patient with eye exam form for baseline ophthalmologic exam.  Provided patient with educational materials on hydroxychloroquine and answered all questions.  Patient consented to hydroxychloroquine.  Will upload consent in the media tab.    Dose will be Plaquenil 200 mg twice daily Monday through Friday.  Prescription pending lab results.  High risk medication use -she will be starting on Plaquenil 200 mg 1 tablet by mouth twice daily Monday through Friday.  CBC and CMP will be drawn today and if labs are stable a prescription for Plaquenil be sent to the pharmacy after lab work has resulted.  She will return for labs in 1 month then 3 months and every 5 months to monitor for drug toxicity.  She was given a Plaquenil eye exam form to take with her to her baseline eye exam appointment.  Patient requested a work note stating that she is considered immunosuppressed and may need to make accommodations at work if need be.- Plan: CBC, CMP  History of anemia: CBC was drawn today.  Tobacco  abuse   Orders: Orders Placed This Encounter  Procedures   XR Hand 2 View Right   XR Hand 2 View Left   XR Foot 2 Views Right   XR Foot 2 Views Left   CBC   CMP   No orders of the defined types were placed in this encounter.   Face-to-face time spent with patient was 35 minutes. Greater than 50% of time was spent in counseling and coordination of care.  Follow-Up Instructions: Return in about 6 weeks (around 04/10/2019) for Rheumatoid arthritis.   Ofilia Neas, PA-C  Note -  This record has been created using [employer].  Chart creation errors have been sought, but may not always  have been located. Such creation errors do not reflect on  the standard of medical care.

## 2019-02-27 ENCOUNTER — Encounter (INDEPENDENT_AMBULATORY_CARE_PROVIDER_SITE_OTHER): Payer: Self-pay

## 2019-02-27 ENCOUNTER — Ambulatory Visit (INDEPENDENT_AMBULATORY_CARE_PROVIDER_SITE_OTHER): Payer: BC Managed Care – PPO | Admitting: Physician Assistant

## 2019-02-27 ENCOUNTER — Ambulatory Visit: Payer: Self-pay

## 2019-02-27 ENCOUNTER — Encounter: Payer: Self-pay | Admitting: Physician Assistant

## 2019-02-27 ENCOUNTER — Encounter: Payer: Self-pay | Admitting: *Deleted

## 2019-02-27 ENCOUNTER — Other Ambulatory Visit: Payer: Self-pay

## 2019-02-27 VITALS — BP 119/79 | HR 95 | Resp 12 | Ht 67.0 in | Wt 151.8 lb

## 2019-02-27 DIAGNOSIS — M0579 Rheumatoid arthritis with rheumatoid factor of multiple sites without organ or systems involvement: Secondary | ICD-10-CM

## 2019-02-27 DIAGNOSIS — Z862 Personal history of diseases of the blood and blood-forming organs and certain disorders involving the immune mechanism: Secondary | ICD-10-CM | POA: Diagnosis not present

## 2019-02-27 DIAGNOSIS — Z79899 Other long term (current) drug therapy: Secondary | ICD-10-CM | POA: Diagnosis not present

## 2019-02-27 DIAGNOSIS — Z720 Tobacco use: Secondary | ICD-10-CM | POA: Diagnosis not present

## 2019-02-27 NOTE — Progress Notes (Signed)
Pharmacy Note  Subjective: Patient presents today to [facility] for follow up office visit.   Patient seen by the pharmacist for counseling on hydroxychloroquine rheumatoid arthritis.  She has been on Plaquenil in the past and tolerated well.  She has a history of non-compliance.  Objective: CMP     Component Value Date/Time   NA 137 08/24/2018 1542   K 4.6 08/24/2018 1542   CL 106 08/24/2018 1542   CO2 22 08/24/2018 1542   GLUCOSE 95 08/24/2018 1542   GLUCOSE 73 11/25/2017 1120   BUN 8 08/24/2018 1542   CREATININE 0.60 08/24/2018 1542   CREATININE 0.66 11/25/2017 1120   CALCIUM 9.6 08/24/2018 1542   PROT 6.8 08/24/2018 1542   ALBUMIN 4.6 08/24/2018 1542   AST 12 08/24/2018 1542   ALT 6 08/24/2018 1542   ALKPHOS 42 08/24/2018 1542   BILITOT <0.2 08/24/2018 1542   GFRNONAA 111 08/24/2018 1542   GFRNONAA 107 11/25/2017 1120   GFRAA 128 08/24/2018 1542   GFRAA 125 11/25/2017 1120    CBC    Component Value Date/Time   WBC 5.8 08/24/2018 1542   WBC 4.9 11/25/2017 1120   RBC 4.01 08/24/2018 1542   RBC 3.84 11/25/2017 1120   HGB 9.2 (L) 08/24/2018 1542   HCT 30.2 (L) 08/24/2018 1542   PLT 366 08/24/2018 1542   MCV 75 (L) 08/24/2018 1542   MCH 22.9 (L) 08/24/2018 1542   MCH 21.9 (L) 11/25/2017 1120   MCHC 30.5 (L) 08/24/2018 1542   MCHC 29.4 (L) 11/25/2017 1120   RDW 17.1 (H) 08/24/2018 1542   LYMPHSABS 1.8 08/24/2018 1542   MONOABS 540 11/25/2016 0950   EOSABS 0.1 08/24/2018 1542   BASOSABS 0.1 08/24/2018 1542    Assessment/Plan: Patient was counseled on the purpose, proper use, and adverse effects of hydroxychloroquine including nausea/diarrhea, skin rash, headaches, and sun sensitivity.  Discussed importance of annual eye exams while on hydroxychloroquine to monitor to ocular toxicity and discussed importance of frequent laboratory monitoring.  Provided patient with eye exam form for baseline ophthalmologic exam and standing lab instructions.  Provided  patient with educational materials on hydroxychloroquine and answered all questions.  Patient consented to hydroxychloroquine.  Will upload consent in the media tab.    Dose will be Plaquenil 200 mg twice daily Monday through Friday.  Prescription pending CBC/CMP results.  All questions encouraged and answered.  Instructed patient to call with any other questions or concerns.  Mariella Saa, PharmD, [facility] [facility], [facility]ist [phone]   02/27/2019 2:22 PM

## 2019-02-28 ENCOUNTER — Telehealth: Payer: Self-pay | Admitting: Pharmacist

## 2019-02-28 DIAGNOSIS — M0579 Rheumatoid arthritis with rheumatoid factor of multiple sites without organ or systems involvement: Secondary | ICD-10-CM

## 2019-02-28 LAB — COMPLETE METABOLIC PANEL WITH GFR
AG Ratio: 1.7 (calc) (ref 1.0–2.5)
ALT: 9 U/L (ref 6–29)
AST: 12 U/L (ref 10–35)
Albumin: 4.3 g/dL (ref 3.6–5.1)
Alkaline phosphatase (APISO): 37 U/L (ref 31–125)
BUN: 12 mg/dL (ref 7–25)
CO2: 25 mmol/L (ref 20–32)
Calcium: 9.4 mg/dL (ref 8.6–10.2)
Chloride: 105 mmol/L (ref 98–110)
Creat: 0.93 mg/dL (ref 0.50–1.10)
GFR, Est African American: 86 mL/min/{1.73_m2} (ref >=60)
GFR, Est Non African American: 74 mL/min/{1.73_m2} (ref >=60)
Globulin: 2.5 g/dL (calc) (ref 1.9–3.7)
Glucose, Bld: 83 mg/dL (ref 65–99)
Potassium: 3.9 mmol/L (ref 3.5–5.3)
Sodium: 137 mmol/L (ref 135–146)
Total Bilirubin: 0.5 mg/dL (ref 0.2–1.2)
Total Protein: 6.8 g/dL (ref 6.1–8.1)

## 2019-02-28 LAB — CBC WITH DIFFERENTIAL/PLATELET
Absolute Monocytes: 680 cells/uL (ref 200–950)
Basophils Absolute: 49 cells/uL (ref 0–200)
Basophils Relative: 0.9 %
Eosinophils Absolute: 38 cells/uL (ref 15–500)
Eosinophils Relative: 0.7 %
HCT: 33.2 % — ABNORMAL LOW (ref 35.0–45.0)
Hemoglobin: 9.7 g/dL — ABNORMAL LOW (ref 11.7–15.5)
Lymphs Abs: 1269 cells/uL (ref 850–3900)
MCH: 22.3 pg — ABNORMAL LOW (ref 27.0–33.0)
MCHC: 29.2 g/dL — ABNORMAL LOW (ref 32.0–36.0)
MCV: 76.3 fL — ABNORMAL LOW (ref 80.0–100.0)
MPV: 9.6 fL (ref 7.5–12.5)
Monocytes Relative: 12.6 %
Neutro Abs: 3364 cells/uL (ref 1500–7800)
Neutrophils Relative %: 62.3 %
Platelets: 356 10*3/uL (ref 140–400)
RBC: 4.35 10*6/uL (ref 3.80–5.10)
RDW: 16 % — ABNORMAL HIGH (ref 11.0–15.0)
Total Lymphocyte: 23.5 %
WBC: 5.4 10*3/uL (ref 3.8–10.8)

## 2019-02-28 MED ORDER — HYDROXYCHLOROQUINE SULFATE 200 MG PO TABS: ORAL_TABLET | ORAL | 0 refills | Status: DC

## 2019-02-28 NOTE — Progress Notes (Signed)
Anemia stable.  Please notify patient and forward labs to PCP. CMP WNL.

## 2019-02-28 NOTE — Telephone Encounter (Signed)
-----   Message from [employer], [facility] sent at 02/27/2019  2:36 PM EST ----- Regarding: Plaquenil New Start Send in script for M-F dosing if labs are normal.

## 2019-02-28 NOTE — Telephone Encounter (Signed)
Labs within normal limits except for anemia which is stable.  Okay to start Plaquenil.  Prescription sent to local pharmacy.

## 2019-03-13 ENCOUNTER — Telehealth: Payer: Self-pay | Admitting: Rheumatology

## 2019-03-13 NOTE — Telephone Encounter (Signed)
Patient called stating she called [facility] and was told they are waiting on prior authorization before they will be able to fill the prescription of Plaquenil.  Patient is requesting a return call on Wednesday, 03/15/19.

## 2019-03-14 NOTE — Telephone Encounter (Signed)
Spoke with pharmacy, they will refax PA to the office to be completed.

## 2019-03-14 NOTE — Telephone Encounter (Signed)
Completed PA via [email or web address] for plaquenil. Received favorable outcome. Attempted to contact patient and voicemail box is not set up yet.

## 2019-04-05 ENCOUNTER — Ambulatory Visit: Payer: BC Managed Care – PPO | Admitting: Rheumatology

## 2019-04-10 ENCOUNTER — Ambulatory Visit: Payer: BC Managed Care – PPO | Admitting: Physician Assistant

## 2019-05-17 ENCOUNTER — Ambulatory Visit: Payer: BC Managed Care – PPO | Admitting: Rheumatology

## 2019-05-31 DIAGNOSIS — M25562 Pain in left knee: Secondary | ICD-10-CM | POA: Diagnosis not present

## 2019-05-31 DIAGNOSIS — N760 Acute vaginitis: Secondary | ICD-10-CM | POA: Diagnosis not present

## 2019-05-31 DIAGNOSIS — S80212A Abrasion, left knee, initial encounter: Secondary | ICD-10-CM | POA: Diagnosis not present

## 2019-06-15 NOTE — Progress Notes (Deleted)
   Office Visit Note  Patient: Alison Lowery             Date of Birth: Aug 02, 1975           MRN: [id number]             PCP: Maximiano Coss, NP Referring: Maximiano Coss, NP Visit Date: 06/22/2019 Occupation: @GUAROCC @  Subjective:  No chief complaint on file.   History of Present Illness: Alison Lowery is a 44 y.o. female ***   Activities of Daily Living:  Patient reports morning stiffness for *** {minute/hour:19697}.   Patient {ACTIONS;DENIES/REPORTS:21021675::"Denies"} nocturnal pain.  Difficulty dressing/grooming: {ACTIONS;DENIES/REPORTS:21021675::"Denies"} Difficulty climbing stairs: {ACTIONS;DENIES/REPORTS:21021675::"Denies"} Difficulty getting out of chair: {ACTIONS;DENIES/REPORTS:21021675::"Denies"} Difficulty using hands for taps, buttons, cutlery, and/or writing: {ACTIONS;DENIES/REPORTS:21021675::"Denies"}  No Rheumatology ROS completed.   PMFS History:  Patient Active Problem List   Diagnosis Date Noted  . Rheumatoid arthritis involving multiple sites with positive rheumatoid factor, positive anti-CCP, +14 33 eta 12/03/2016  . Elevated rheumatoid factor 11/18/2016  . Tobacco abuse 11/18/2016  . History of abnormal electrocardiogram 11/18/2016  . History of anemia 11/18/2016  . History of diarrhea 11/18/2016  . Breast mass, right 10/21/2010    Past Medical History:  Diagnosis Date  . Arthritis   . Ectopic pregnancy   . Rheumatoid arthritis ([place])     No family history on file. No past surgical history on file. Social History   Social History Narrative  . Not on file    There is no immunization history on file for this patient.   Objective: Vital Signs: There were no vitals taken for this visit.   Physical Exam   Musculoskeletal Exam: ***  CDAI Exam: CDAI Score: -- Patient Global: --; Provider Global: -- Swollen: --; Tender: -- Joint Exam 06/22/2019   No joint exam has been documented for this visit   There is currently no information  documented on the homunculus. Go to the Rheumatology activity and complete the homunculus joint exam.  Investigation: No additional findings.  Imaging: No results found.  Recent Labs: Lab Results  Component Value Date   WBC 5.4 02/27/2019   HGB 9.7 (L) 02/27/2019   PLT 356 02/27/2019   NA 137 02/27/2019   K 3.9 02/27/2019   CL 105 02/27/2019   CO2 25 02/27/2019   GLUCOSE 83 02/27/2019   BUN 12 02/27/2019   CREATININE 0.93 02/27/2019   BILITOT 0.5 02/27/2019   ALKPHOS 42 08/24/2018   AST 12 02/27/2019   ALT 9 02/27/2019   PROT 6.8 02/27/2019   ALBUMIN 4.6 08/24/2018   CALCIUM 9.4 02/27/2019   GFRAA 86 02/27/2019   QFTBGOLD NEGATIVE 12/30/2016    Speciality Comments: No specialty comments available.  Procedures:  No procedures performed Allergies: Patient has no known allergies.   Assessment / Plan:     Visit Diagnoses: No diagnosis found.  Orders: No orders of the defined types were placed in this encounter.  No orders of the defined types were placed in this encounter.   Face-to-face time spent with patient was *** minutes. Greater than 50% of time was spent in counseling and coordination of care.  Follow-Up Instructions: No follow-ups on file.   Earnestine Mealing, CMA  Note - This record has been created using [employer], commissioning.  Chart creation errors have been sought, but may not always  have been located. Such creation errors do not reflect on  the standard of medical care.

## 2019-06-22 ENCOUNTER — Ambulatory Visit: Payer: BC Managed Care – PPO | Admitting: Physician Assistant

## 2019-06-26 NOTE — Progress Notes (Deleted)
   Office Visit Note  Patient: Alison Lowery             Date of Birth: [birth year]           MRN: [id number]             PCP: Maximiano Coss, NP Referring: Maximiano Coss, NP Visit Date: 07/05/2019 Occupation: @GUAROCC @  Subjective:  No chief complaint on file.   History of Present Illness: Alison Lowery is a 44 y.o. female ***   Activities of Daily Living:  Patient reports morning stiffness for *** {minute/hour:19697}.   Patient {ACTIONS;DENIES/REPORTS:21021675::"Denies"} nocturnal pain.  Difficulty dressing/grooming: {ACTIONS;DENIES/REPORTS:21021675::"Denies"} Difficulty climbing stairs: {ACTIONS;DENIES/REPORTS:21021675::"Denies"} Difficulty getting out of chair: {ACTIONS;DENIES/REPORTS:21021675::"Denies"} Difficulty using hands for taps, buttons, cutlery, and/or writing: {ACTIONS;DENIES/REPORTS:21021675::"Denies"}  No Rheumatology ROS completed.   PMFS History:  Patient Active Problem List   Diagnosis Date Noted  . Rheumatoid arthritis involving multiple sites with positive rheumatoid factor, positive anti-CCP, +14 33 eta 12/03/2016  . Elevated rheumatoid factor 11/18/2016  . Tobacco abuse 11/18/2016  . History of abnormal electrocardiogram 11/18/2016  . History of anemia 11/18/2016  . History of diarrhea 11/18/2016  . Breast mass, right 10/21/2010    Past Medical History:  Diagnosis Date  . Arthritis   . Ectopic pregnancy   . Rheumatoid arthritis ([place])     No family history on file. No past surgical history on file. Social History   Social History Narrative  . Not on file    There is no immunization history on file for this patient.   Objective: Vital Signs: There were no vitals taken for this visit.   Physical Exam   Musculoskeletal Exam: ***  CDAI Exam: CDAI Score: -- Patient Global: --; Provider Global: -- Swollen: --; Tender: -- Joint Exam 07/05/2019   No joint exam has been documented for this visit   There is currently no information  documented on the homunculus. Go to the Rheumatology activity and complete the homunculus joint exam.  Investigation: No additional findings.  Imaging: No results found.  Recent Labs: Lab Results  Component Value Date   WBC 5.4 02/27/2019   HGB 9.7 (L) 02/27/2019   PLT 356 02/27/2019   NA 137 02/27/2019   K 3.9 02/27/2019   CL 105 02/27/2019   CO2 25 02/27/2019   GLUCOSE 83 02/27/2019   BUN 12 02/27/2019   CREATININE 0.93 02/27/2019   BILITOT 0.5 02/27/2019   ALKPHOS 42 08/24/2018   AST 12 02/27/2019   ALT 9 02/27/2019   PROT 6.8 02/27/2019   ALBUMIN 4.6 08/24/2018   CALCIUM 9.4 02/27/2019   GFRAA 86 02/27/2019   QFTBGOLD NEGATIVE 12/30/2016    Speciality Comments: No specialty comments available.  Procedures:  No procedures performed Allergies: Patient has no known allergies.   Assessment / Plan:     Visit Diagnoses: No diagnosis found.  Orders: No orders of the defined types were placed in this encounter.  No orders of the defined types were placed in this encounter.   Face-to-face time spent with patient was *** minutes. Greater than 50% of time was spent in counseling and coordination of care.  Follow-Up Instructions: No follow-ups on file.   Earnestine Mealing, CMA  Note - This record has been created using Editor, commissioning.  Chart creation errors have been sought, but may not always  have been located. Such creation errors do not reflect on  the standard of medical care.

## 2019-07-05 ENCOUNTER — Ambulatory Visit: Payer: Self-pay | Admitting: Physician Assistant

## 2019-11-24 DIAGNOSIS — Z20822 Contact with and (suspected) exposure to covid-19: Secondary | ICD-10-CM | POA: Diagnosis not present

## 2020-06-28 DIAGNOSIS — F419 Anxiety disorder, unspecified: Secondary | ICD-10-CM | POA: Diagnosis not present

## 2020-07-24 DIAGNOSIS — F419 Anxiety disorder, unspecified: Secondary | ICD-10-CM | POA: Diagnosis not present

## 2020-08-13 DIAGNOSIS — L600 Ingrowing nail: Secondary | ICD-10-CM | POA: Diagnosis not present

## 2020-09-11 ENCOUNTER — Other Ambulatory Visit: Payer: Self-pay

## 2020-09-11 ENCOUNTER — Encounter (HOSPITAL_COMMUNITY): Payer: Self-pay

## 2020-09-11 ENCOUNTER — Emergency Department (HOSPITAL_COMMUNITY)
Admission: EM | Admit: 2020-09-11 | Discharge: 2020-09-12 | Disposition: A | Discharge status: Home / Self Care | Payer: BC Managed Care – PPO | Attending: Emergency Medicine | Admitting: Emergency Medicine

## 2020-09-11 DIAGNOSIS — Z5321 Procedure and treatment not carried out due to patient leaving prior to being seen by health care provider: Secondary | ICD-10-CM | POA: Insufficient documentation

## 2020-09-11 DIAGNOSIS — H5789 Other specified disorders of eye and adnexa: Secondary | ICD-10-CM | POA: Diagnosis not present

## 2020-09-11 DIAGNOSIS — M79672 Pain in left foot: Secondary | ICD-10-CM | POA: Diagnosis not present

## 2020-09-11 MED ORDER — TETANUS-DIPHTH-ACELL PERTUSSIS 5-2.5-18.5 LF-MCG/0.5 IM SUSY: 0.5000 mL | PREFILLED_SYRINGE | Freq: Once | INTRAMUSCULAR | Status: DC

## 2020-09-11 MED ORDER — FLUORESCEIN SODIUM 1 MG OP STRP: 1.0000 | ORAL_STRIP | Freq: Once | OPHTHALMIC | Status: DC

## 2020-09-11 MED ORDER — TETRACAINE HCL 0.5 % OP SOLN: 2.0000 [drp] | Freq: Once | OPHTHALMIC | Status: DC

## 2020-09-11 NOTE — ED Provider Notes (Signed)
Emergency Medicine Provider Triage Evaluation Note  Alison Lowery , a 45 y.o. female  was evaluated in triage.  Pt complains of eye irritation.  Review of Systems  Positive: fb sensation to L eye, eye irritation Negative: Fever, diplopia  Physical Exam  Ht 5\' 7"  (1.702 m)   Wt 68.9 kg   BMI 23.79 kg/m  Gen:   Awake, no distress   Resp:  Normal effort  MSK:   Moves extremities without difficulty  Other:  L eye there's a fb approximately 64mm noted to L upper cornea  Medical Decision Making  Medically screening exam initiated at 9:47 PM.  Appropriate orders placed.  Alison Lowery was informed that the remainder of the evaluation will be completed by another provider, this initial triage assessment does not replace that evaluation, and the importance of remaining in the ED until their evaluation is complete.  fb sensation to L eye x 6 days, with irritation.  Small white speck of fb noted to cornea on visual exam.  Unsure last tetanus status.    Domenic Moras, PA-C 09/11/20 2159    Alison Dessert, MD 09/11/20 [phone]

## 2020-09-11 NOTE — ED Triage Notes (Signed)
Feels like left eye has something in it x 6 days. Pt has tried flushing but no relief. States theres a white spot (constant) and feels like irritation.  Reports of yellowish discharge on Saturday x 1.    Left foot pain (nerve pain?) denies any recent trauma/ surgeries when she touches it.

## 2020-09-12 DIAGNOSIS — H179 Unspecified corneal scar and opacity: Secondary | ICD-10-CM | POA: Diagnosis not present

## 2020-09-12 DIAGNOSIS — H16002 Unspecified corneal ulcer, left eye: Secondary | ICD-10-CM | POA: Diagnosis not present

## 2020-09-12 NOTE — ED Notes (Signed)
Vitals called x2

## 2020-09-12 NOTE — ED Notes (Signed)
Called Pt for vitals no answer. 

## 2020-09-16 DIAGNOSIS — M069 Rheumatoid arthritis, unspecified: Secondary | ICD-10-CM | POA: Diagnosis not present

## 2020-09-16 DIAGNOSIS — Z79899 Other long term (current) drug therapy: Secondary | ICD-10-CM | POA: Diagnosis not present

## 2020-09-16 DIAGNOSIS — H16002 Unspecified corneal ulcer, left eye: Secondary | ICD-10-CM | POA: Diagnosis not present

## 2020-09-18 DIAGNOSIS — H16002 Unspecified corneal ulcer, left eye: Secondary | ICD-10-CM | POA: Diagnosis not present

## 2020-09-23 DIAGNOSIS — H16002 Unspecified corneal ulcer, left eye: Secondary | ICD-10-CM | POA: Diagnosis not present

## 2020-09-23 DIAGNOSIS — Z79899 Other long term (current) drug therapy: Secondary | ICD-10-CM | POA: Diagnosis not present

## 2020-09-23 NOTE — Progress Notes (Signed)
Office Visit Note  Patient: Alison Lowery             Date of Birth: [birth year]           MRN: [id number]             PCP: Pcp, No Referring: No ref. provider found Visit Date: 09/24/2020 Occupation: @GUAROCC @  Subjective:  Diagnosed with corneal ulcer   History of Present Illness: Alison Lowery is a 45 y.o. female with history of seropositive erosive rheumatoid arthritis.  She returns today after her last visit in November 2020.  She states she did not take hydroxychloroquine after her last visit.  She was recently evaluated at [facility] eye care and there was question about corneal ulcer and that was the reason she was referred back to [place].  She denies having any joint pain or joint swelling except for some pain and discomfort in her left ankle joint.  Activities of Daily Living:  Patient reports morning stiffness for 0 minutes.   Patient Denies nocturnal pain.  Difficulty dressing/grooming: Denies Difficulty climbing stairs: Denies Difficulty getting out of chair: Denies Difficulty using hands for taps, buttons, cutlery, and/or writing: Denies  Review of Systems  Constitutional:  Negative for fatigue, night sweats, weight gain and weight loss.  HENT:  Positive for mouth dryness. Negative for mouth sores, trouble swallowing, trouble swallowing and nose dryness.   Eyes:  Negative for pain, redness, itching, visual disturbance and dryness.  Respiratory:  Negative for cough, shortness of breath and difficulty breathing.   Cardiovascular:  Negative for chest pain, palpitations, hypertension, irregular heartbeat and swelling in legs/feet.  Gastrointestinal:  Negative for blood in stool, constipation and diarrhea.  Endocrine: Negative for increased urination.  Genitourinary:  Negative for difficulty urinating and vaginal dryness.  Musculoskeletal:  Positive for joint swelling. Negative for joint pain, joint pain, myalgias, muscle weakness, morning stiffness, muscle tenderness and myalgias.   Skin:  Negative for color change, rash, hair loss, redness, skin tightness, ulcers and sensitivity to sunlight.  Allergic/Immunologic: Negative for susceptible to infections.  Neurological:  Negative for dizziness, numbness, headaches, memory loss, night sweats and weakness.  Hematological:  Positive for bruising/bleeding tendency. Negative for swollen glands.  Psychiatric/Behavioral:  Negative for depressed mood, confusion and sleep disturbance. The patient is not nervous/anxious.    PMFS History:  Patient Active Problem List   Diagnosis Date Noted   Rheumatoid arthritis involving multiple sites with positive rheumatoid factor, positive anti-CCP, +14 33 eta 12/03/2016   Elevated rheumatoid factor 11/18/2016   Tobacco abuse 11/18/2016   History of abnormal electrocardiogram 11/18/2016   History of anemia 11/18/2016   History of diarrhea 11/18/2016   Breast mass, right 10/21/2010    Past Medical History:  Diagnosis Date   Arthritis    Ectopic pregnancy    Rheumatoid arthritis ([place])     History reviewed. No pertinent family history. History reviewed. No pertinent surgical history. Social History   Social History Narrative   Not on file   There is no immunization history for the selected administration types on file for this patient.   Objective: Vital Signs: BP 107/67 (BP Location: Right Arm, Patient Position: Sitting, Cuff Size: Normal)   Pulse 78   Resp 16   Ht 5\' 7"  (1.702 m)   Wt 145 lb 9.6 oz (66 kg)   BMI 22.80 kg/m    Physical Exam Vitals and nursing note reviewed.  Constitutional:      Appearance: She is well-developed.  HENT:  Head: Normocephalic and atraumatic.  Eyes:     Conjunctiva/sclera: Conjunctivae normal.  Cardiovascular:     Rate and Rhythm: Normal rate and regular rhythm.     Heart sounds: Normal heart sounds.  Pulmonary:     Effort: Pulmonary effort is normal.     Breath sounds: Normal breath sounds.  Abdominal:     General: Bowel sounds  are normal.     Palpations: Abdomen is soft.  Musculoskeletal:     Cervical back: Normal range of motion.  Lymphadenopathy:     Cervical: No cervical adenopathy.  Skin:    General: Skin is warm and dry.     Capillary Refill: Capillary refill takes less than 2 seconds.  Neurological:     Mental Status: She is alert and oriented to person, place, and time.  Psychiatric:        Behavior: Behavior normal.     Musculoskeletal Exam: C-spine was in good range of motion.  Shoulder joints, elbow joints, wrist joints, MCPs PIPs and DIPs with good range of motion with no synovitis.  Hip joints, knee joints, ankles with good range of motion.  She had warmth and swelling over her left ankle joint.  There was no tenderness over MTPs.  CDAI Exam: CDAI Score: 0.6  Patient Global: 2 mm; Provider Global: 4 mm Swollen: 1 ; Tender: 1  Joint Exam 09/24/2020      Right  Left  Ankle     Swollen Tender     Investigation: No additional findings.  Imaging: No results found.  Recent Labs: Lab Results  Component Value Date   WBC 5.4 02/27/2019   HGB 9.7 (L) 02/27/2019   PLT 356 02/27/2019   NA 137 02/27/2019   K 3.9 02/27/2019   CL 105 02/27/2019   CO2 25 02/27/2019   GLUCOSE 83 02/27/2019   BUN 12 02/27/2019   CREATININE 0.93 02/27/2019   BILITOT 0.5 02/27/2019   ALKPHOS 42 08/24/2018   AST 12 02/27/2019   ALT 9 02/27/2019   PROT 6.8 02/27/2019   ALBUMIN 4.6 08/24/2018   CALCIUM 9.4 02/27/2019   GFRAA 86 02/27/2019   QFTBGOLD NEGATIVE 12/30/2016    Speciality Comments: PLQ Eye Exam: 09/16/2020 WNL @ [facility]  Procedures:  No procedures performed Allergies: Patient has no known allergies.   Assessment / Plan:     Visit Diagnoses: Rheumatoid arthritis involving multiple sites with positive rheumatoid factor, positive anti-CCP, +14 33 eta - Erosion right 5th MTP: Patient returns today after her last visit in November 2020.  She states she stopped Plaquenil after that  visit.  She states she has been doing well as regards to her rheumatoid arthritis.  She has been having intermittent pain and swelling in her left ankle joint which is swollen today.  She had warmth and swelling over her left ankle joint.  None of the other joints are swollen.  I offered getting repeat x-rays of her hands and her feet. Patient would prefer to get x-rays at the follow-up visit.  We also had detailed discussion regarding methotrexate with a history of erosive disease and corneal ulcer, methotrexate would be preferred choice.  Patient is hesitant to go on methotrexate.  She stated that she will discuss that at the follow-up visit.  Today she wants to start on Plaquenil.  I explained to her that Plaquenil will not be effective in the treatment of eye disease.  She states that the corneal ulcer was not confirmed.  We will  request records from Dr. Zenia Resides office.  I received records from [facility] eye care after patient left and reviewed.  She was diagnosed with corneal ulcer although mentioned not typical ulcer.  She was treated with moxifloxacin and doxycycline.  Patient states that redness in her eye resolved after the treatment.  I am uncertain if the ulcer was related to rheumatoid arthritis or not as she responded to the antibiotic eyedrops.  High risk medication use -I will obtain labs today.  I will also send a prescription for Plaquenil 200 mg twice daily Monday through Friday.  Plaquenil was started at the last visit on 02/27/2019.  Baseline eye examination and then yearly eye examination was discussed.- Plan: CBC with Differential/Platelet, COMPLETE METABOLIC PANEL WITH GFR  History of anemia  Tobacco abuse-strong association of rheumatoid arthritis with smoking was discussed.  Smoking cessation was discussed.  Orders: Orders Placed This Encounter  Procedures   CBC with Differential/Platelet   COMPLETE METABOLIC PANEL WITH GFR   Meds ordered this encounter  Medications    hydroxychloroquine (PLAQUENIL) 200 MG tablet    Sig: Take 1 tablet by mouth twice daily, Monday through Friday only.    Dispense:  120 tablet    Refill:  0      Follow-Up Instructions: Return in about 4 weeks (around 10/22/2020) for Rheumatoid arthritis.   Bo Merino, MD  Note - This record has been created using Editor, commissioning.  Chart creation errors have been sought, but may not always  have been located. Such creation errors do not reflect on  the standard of medical care.

## 2020-09-24 ENCOUNTER — Other Ambulatory Visit: Payer: Self-pay

## 2020-09-24 ENCOUNTER — Encounter: Payer: Self-pay | Admitting: Rheumatology

## 2020-09-24 ENCOUNTER — Ambulatory Visit (INDEPENDENT_AMBULATORY_CARE_PROVIDER_SITE_OTHER): Payer: BC Managed Care – PPO | Admitting: Rheumatology

## 2020-09-24 VITALS — BP 107/67 | HR 78 | Resp 16 | Ht 67.0 in | Wt 145.6 lb

## 2020-09-24 DIAGNOSIS — Z720 Tobacco use: Secondary | ICD-10-CM | POA: Diagnosis not present

## 2020-09-24 DIAGNOSIS — Z862 Personal history of diseases of the blood and blood-forming organs and certain disorders involving the immune mechanism: Secondary | ICD-10-CM | POA: Diagnosis not present

## 2020-09-24 DIAGNOSIS — M0579 Rheumatoid arthritis with rheumatoid factor of multiple sites without organ or systems involvement: Secondary | ICD-10-CM

## 2020-09-24 DIAGNOSIS — Z79899 Other long term (current) drug therapy: Secondary | ICD-10-CM

## 2020-09-24 LAB — CBC WITH DIFFERENTIAL/PLATELET
Absolute Monocytes: 584 cells/uL (ref 200–950)
Basophils Absolute: 60 cells/uL (ref 0–200)
Basophils Relative: 1.3 %
Eosinophils Absolute: 51 cells/uL (ref 15–500)
Eosinophils Relative: 1.1 %
HCT: 30.7 % — ABNORMAL LOW (ref 35.0–45.0)
Hemoglobin: 8.6 g/dL — ABNORMAL LOW (ref 11.7–15.5)
Lymphs Abs: 1297 cells/uL (ref 850–3900)
MCH: 20.2 pg — ABNORMAL LOW (ref 27.0–33.0)
MCHC: 28 g/dL — ABNORMAL LOW (ref 32.0–36.0)
MCV: 72.1 fL — ABNORMAL LOW (ref 80.0–100.0)
MPV: 10 fL (ref 7.5–12.5)
Monocytes Relative: 12.7 %
Neutro Abs: 2608 cells/uL (ref 1500–7800)
Neutrophils Relative %: 56.7 %
Platelets: 253 10*3/uL (ref 140–400)
RBC: 4.26 10*6/uL (ref 3.80–5.10)
RDW: 18.1 % — ABNORMAL HIGH (ref 11.0–15.0)
Total Lymphocyte: 28.2 %
WBC: 4.6 10*3/uL (ref 3.8–10.8)

## 2020-09-24 LAB — COMPLETE METABOLIC PANEL WITH GFR
AG Ratio: 1.7 (calc) (ref 1.0–2.5)
ALT: 6 U/L (ref 6–29)
AST: 12 U/L (ref 10–35)
Albumin: 4.3 g/dL (ref 3.6–5.1)
Alkaline phosphatase (APISO): 41 U/L (ref 31–125)
BUN: 8 mg/dL (ref 7–25)
CO2: 27 mmol/L (ref 20–32)
Calcium: 9.3 mg/dL (ref 8.6–10.2)
Chloride: 107 mmol/L (ref 98–110)
Creat: 0.59 mg/dL (ref 0.50–1.10)
GFR, Est African American: 127 mL/min/{1.73_m2} (ref >=60)
GFR, Est Non African American: 110 mL/min/{1.73_m2} (ref >=60)
Globulin: 2.6 g/dL (calc) (ref 1.9–3.7)
Glucose, Bld: 78 mg/dL (ref 65–99)
Potassium: 3.8 mmol/L (ref 3.5–5.3)
Sodium: 138 mmol/L (ref 135–146)
Total Bilirubin: 0.4 mg/dL (ref 0.2–1.2)
Total Protein: 6.9 g/dL (ref 6.1–8.1)

## 2020-09-24 MED ORDER — HYDROXYCHLOROQUINE SULFATE 200 MG PO TABS: ORAL_TABLET | ORAL | 0 refills | Status: DC

## 2020-09-25 NOTE — Progress Notes (Signed)
Severe anemia noted.  Patient should take multivitamin with iron.  She also should be seen by her PCP for further evaluation of anemia.  CMP is normal.

## 2020-10-11 NOTE — Progress Notes (Deleted)
Office Visit Note  Patient: Alison Lowery             Date of Birth: 30-May-1975           MRN: [id number]             PCP: Pcp, No Referring: No ref. provider found Visit Date: 10/24/2020 Occupation: @GUAROCC @  Subjective:  No chief complaint on file.   History of Present Illness: Alison Lowery is a 45 y.o. female ***   Obtain x-rays of hands and feet  Started on PLQ Did not want to start MTX Dr. Katy Fitch   Activities of Daily Living:  Patient reports morning stiffness for *** {minute/hour:19697}.   Patient {ACTIONS;DENIES/REPORTS:21021675::"Denies"} nocturnal pain.  Difficulty dressing/grooming: {ACTIONS;DENIES/REPORTS:21021675::"Denies"} Difficulty climbing stairs: {ACTIONS;DENIES/REPORTS:21021675::"Denies"} Difficulty getting out of chair: {ACTIONS;DENIES/REPORTS:21021675::"Denies"} Difficulty using hands for taps, buttons, cutlery, and/or writing: {ACTIONS;DENIES/REPORTS:21021675::"Denies"}  No Rheumatology ROS completed.   PMFS History:  Patient Active Problem List   Diagnosis Date Noted   Rheumatoid arthritis involving multiple sites with positive rheumatoid factor, positive anti-CCP, +14 33 eta 12/03/2016   Elevated rheumatoid factor 11/18/2016   Tobacco abuse 11/18/2016   History of abnormal electrocardiogram 11/18/2016   History of anemia 11/18/2016   History of diarrhea 11/18/2016   Breast mass, right 10/21/2010    Past Medical History:  Diagnosis Date   Arthritis    Ectopic pregnancy    Rheumatoid arthritis ([place])     No family history on file. No past surgical history on file. Social History   Social History Narrative   Not on file   There is no immunization history for the selected administration types on file for this patient.   Objective: Vital Signs: There were no vitals taken for this visit.   Physical Exam   Musculoskeletal Exam: ***  CDAI Exam: CDAI Score: -- Patient Global: --; Provider Global: -- Swollen: --; Tender: -- Joint Exam  10/24/2020   No joint exam has been documented for this visit   There is currently no information documented on the homunculus. Go to the Rheumatology activity and complete the homunculus joint exam.  Investigation: No additional findings.  Imaging: No results found.  Recent Labs: Lab Results  Component Value Date   WBC 4.6 09/24/2020   HGB 8.6 (L) 09/24/2020   PLT 253 09/24/2020   NA 138 09/24/2020   K 3.8 09/24/2020   CL 107 09/24/2020   CO2 27 09/24/2020   GLUCOSE 78 09/24/2020   BUN 8 09/24/2020   CREATININE 0.59 09/24/2020   BILITOT 0.4 09/24/2020   ALKPHOS 42 08/24/2018   AST 12 09/24/2020   ALT 6 09/24/2020   PROT 6.9 09/24/2020   ALBUMIN 4.6 08/24/2018   CALCIUM 9.3 09/24/2020   GFRAA 127 09/24/2020   QFTBGOLD NEGATIVE 12/30/2016    Speciality Comments: PLQ Eye Exam: 09/16/2020 WNL @ [facility]  Procedures:  No procedures performed Allergies: Patient has no known allergies.   Assessment / Plan:     Visit Diagnoses: Rheumatoid arthritis involving multiple sites with positive rheumatoid factor, positive anti-CCP, +14 33 eta - Erosion right 5th MTP  High risk medication use - Started on PLQ at last visit on 09/24/20.  apprehensive to start MTX.  History of anemia  Tobacco abuse  Orders: No orders of the defined types were placed in this encounter.  No orders of the defined types were placed in this encounter.   Face-to-face time spent with patient was *** minutes. Greater than 50% of time was spent  in counseling and coordination of care.  Follow-Up Instructions: No follow-ups on file.   Alison Neas, PA-C  Note - This record has been created using Dragon software.  Chart creation errors have been sought, but may not always  have been located. Such creation errors do not reflect on  the standard of medical care.

## 2020-10-24 ENCOUNTER — Ambulatory Visit: Payer: BC Managed Care – PPO | Admitting: Physician Assistant

## 2020-10-28 ENCOUNTER — Ambulatory Visit: Payer: BC Managed Care – PPO | Admitting: Physician Assistant

## 2020-10-28 NOTE — Progress Notes (Deleted)
Office Visit Note  Patient: Alison Lowery             Date of Birth: August 05, 1975           MRN: [id number]             PCP: Pcp, No Referring: No ref. provider found Visit Date: 10/28/2020 Occupation: @GUAROCC @  Subjective:    History of Present Illness: Alison Lowery is a 45 y.o. female with history of rheumatoid arthritis. She was restarted on plaquenil at her last office visit on 09/24/20.    Activities of Daily Living:  Patient reports morning stiffness for *** {minute/hour:19697}.   Patient {ACTIONS;DENIES/REPORTS:21021675::"Denies"} nocturnal pain.  Difficulty dressing/grooming: {ACTIONS;DENIES/REPORTS:21021675::"Denies"} Difficulty climbing stairs: {ACTIONS;DENIES/REPORTS:21021675::"Denies"} Difficulty getting out of chair: {ACTIONS;DENIES/REPORTS:21021675::"Denies"} Difficulty using hands for taps, buttons, cutlery, and/or writing: {ACTIONS;DENIES/REPORTS:21021675::"Denies"}  No Rheumatology ROS completed.   PMFS History:  Patient Active Problem List   Diagnosis Date Noted   Rheumatoid arthritis involving multiple sites with positive rheumatoid factor, positive anti-CCP, +14 33 eta 12/03/2016   Elevated rheumatoid factor 11/18/2016   Tobacco abuse 11/18/2016   History of abnormal electrocardiogram 11/18/2016   History of anemia 11/18/2016   History of diarrhea 11/18/2016   Breast mass, right 10/21/2010    Past Medical History:  Diagnosis Date   Arthritis    Ectopic pregnancy    Rheumatoid arthritis ([place])     No family history on file. No past surgical history on file. Social History   Social History Narrative   Not on file   There is no immunization history for the selected administration types on file for this patient.   Objective: Vital Signs: There were no vitals taken for this visit.   Physical Exam Vitals and nursing note reviewed.  Constitutional:      Appearance: She is well-developed.  HENT:     Head: Normocephalic and atraumatic.  Eyes:      Conjunctiva/sclera: Conjunctivae normal.  Pulmonary:     Effort: Pulmonary effort is normal.  Abdominal:     Palpations: Abdomen is soft.  Musculoskeletal:     Cervical back: Normal range of motion.  Skin:    General: Skin is warm and dry.     Capillary Refill: Capillary refill takes less than 2 seconds.  Neurological:     Mental Status: She is alert and oriented to person, place, and time.  Psychiatric:        Behavior: Behavior normal.     Musculoskeletal Exam: ***  CDAI Exam: CDAI Score: -- Patient Global: --; Provider Global: -- Swollen: --; Tender: -- Joint Exam 10/28/2020   No joint exam has been documented for this visit   There is currently no information documented on the homunculus. Go to the Rheumatology activity and complete the homunculus joint exam.  Investigation: No additional findings.  Imaging: No results found.  Recent Labs: Lab Results  Component Value Date   WBC 4.6 09/24/2020   HGB 8.6 (L) 09/24/2020   PLT 253 09/24/2020   NA 138 09/24/2020   K 3.8 09/24/2020   CL 107 09/24/2020   CO2 27 09/24/2020   GLUCOSE 78 09/24/2020   BUN 8 09/24/2020   CREATININE 0.59 09/24/2020   BILITOT 0.4 09/24/2020   ALKPHOS 42 08/24/2018   AST 12 09/24/2020   ALT 6 09/24/2020   PROT 6.9 09/24/2020   ALBUMIN 4.6 08/24/2018   CALCIUM 9.3 09/24/2020   GFRAA 127 09/24/2020   QFTBGOLD NEGATIVE 12/30/2016    Speciality Comments: PLQ Eye Exam:  09/16/2020 WNL @ [facility]  Procedures:  No procedures performed Allergies: Patient has no known allergies.   Assessment / Plan:     Visit Diagnoses: Rheumatoid arthritis involving multiple sites with positive rheumatoid factor, positive anti-CCP, +14 33 eta - - Erosion right 5th.  Off PLQ for over 1 year.  High risk medication use  History of anemia  Tobacco abuse  Orders: No orders of the defined types were placed in this encounter.  No orders of the defined types were placed in this  encounter.    Follow-Up Instructions: No follow-ups on file.   Ofilia Neas, PA-C  Note - This record has been created using Dragon software.  Chart creation errors have been sought, but may not always  have been located. Such creation errors do not reflect on  the standard of medical care.

## 2020-12-27 NOTE — Progress Notes (Signed)
Office Visit Note  Patient: Alison Lowery             Date of Birth: May 14, 1975           MRN: [id number]             PCP: Pcp, No Referring: No ref. provider found Visit Date: 01/02/2021 Occupation: '@GUAROCC'$ @  Subjective:  PLQ follow up   History of Present Illness: Alison Lowery is a 45 y.o. female with history of seropositive rheumatoid arthritis and osteoarthritis.  She is restarted on Plaquenil 200 mg 1 tablet by mouth twice daily Monday through Friday at her last office visit on 09/24/2020.  According to the patient she has been tolerating Plaquenil but often misses several doses of Plaquenil per week.  She has not been taking any over-the-counter products for pain relief.  She has occasional pain in both ankle joints after walking prolonged distances.  She notices intermittent swelling in her left ankle.  She denies any nocturnal pain.  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  She denies any other joint pain or joint swelling at this time. She states that she has not been seen at [facility] since her last appointment with Dr. Estanislado Pandy. She denies any recurrence of the symptoms she was experiencing with the questionable corneal ulcer.  She has not had any eye pain, redness, photophobia, or vision changes.    Activities of Daily Living:  Patient reports morning stiffness for 0  none .   Patient Denies nocturnal pain.  Difficulty dressing/grooming: Denies Difficulty climbing stairs: Denies Difficulty getting out of chair: Denies Difficulty using hands for taps, buttons, cutlery, and/or writing: Denies  Review of Systems  Constitutional:  Positive for fatigue.  HENT:  Positive for mouth dryness. Negative for mouth sores and nose dryness.   Eyes:  Negative for pain, visual disturbance and dryness.  Respiratory:  Negative for cough, hemoptysis, shortness of breath and difficulty  breathing.   Cardiovascular:  Negative for chest pain, palpitations, hypertension and swelling in legs/feet.  Gastrointestinal:  Negative for blood in stool, constipation and diarrhea.  Genitourinary:  Negative for difficulty urinating and painful urination.  Musculoskeletal:  Positive for joint swelling. Negative for joint pain, joint pain, myalgias, muscle weakness, morning stiffness, muscle tenderness and myalgias.  Skin:  Negative for color change, pallor, rash, hair loss, nodules/bumps, skin tightness, ulcers and sensitivity to sunlight.  Allergic/Immunologic: Negative for susceptible to infections.  Neurological:  Negative for dizziness, numbness, headaches and weakness.  Hematological:  Negative for bruising/bleeding tendency and swollen glands.  Psychiatric/Behavioral:  Negative for depressed mood and sleep disturbance. The patient is not nervous/anxious.    PMFS History:  Patient Active Problem List   Diagnosis Date Noted   Rheumatoid arthritis involving multiple sites with positive rheumatoid factor, positive anti-CCP, +14 33 eta 12/03/2016   Elevated rheumatoid factor 11/18/2016   Tobacco abuse 11/18/2016   History of abnormal electrocardiogram 11/18/2016   History of anemia 11/18/2016   History of diarrhea 11/18/2016   Breast mass, right 10/21/2010    Past Medical History:  Diagnosis Date   Arthritis    Ectopic pregnancy    Rheumatoid arthritis ([place])     History reviewed. No pertinent family history. History reviewed. No pertinent surgical history. Social History   Social History Narrative   Not on file   There is no immunization history for the selected administration types on  file for this patient.   Objective: Vital Signs: BP 114/63 (BP Location: Left Arm, Patient Position: Sitting, Cuff Size: Normal)   Pulse 81   Resp 14   Ht '5\' 7"'$  (1.702 m)   Wt 147 lb (66.7 kg)   BMI 23.02 kg/m    Physical Exam Vitals and nursing note reviewed.  Constitutional:       Appearance: She is well-developed.  HENT:     Head: Normocephalic and atraumatic.  Eyes:     Conjunctiva/sclera: Conjunctivae normal.  Pulmonary:     Effort: Pulmonary effort is normal.  Abdominal:     Palpations: Abdomen is soft.  Musculoskeletal:     Cervical back: Normal range of motion.  Skin:    General: Skin is warm and dry.     Capillary Refill: Capillary refill takes less than 2 seconds.  Neurological:     Mental Status: She is alert and oriented to person, place, and time.  Psychiatric:        Behavior: Behavior normal.     Musculoskeletal Exam: C-spine, thoracic spine, lumbar spine have good range of motion with no discomfort.  Shoulder joints, elbow joints, wrist joints, MCPs, PIPs, DIPs have good range of motion with no synovitis.  Complete fist formation bilaterally.  Hip joints have good range of motion with no discomfort.  Knee joints have good range of motion with no warmth or effusion.  Ankle joints have good range of motion with synovial thickening of the left ankle.  No tenderness or synovitis of ankle joints.  No tenderness over MTP joints.       CDAI Exam: CDAI Score: 0.2  Patient Global: 1 mm; Provider Global: 1 mm Swollen: 0 ; Tender: 0  Joint Exam 01/02/2021   No joint exam has been documented for this visit   There is currently no information documented on the homunculus. Go to the Rheumatology activity and complete the homunculus joint exam.  Investigation: No additional findings.  Imaging: XR Foot 2 Views Left  Result Date: 01/02/2021 First MTP narrowing and subluxation was noted.  PIP and DIP narrowing was noted.  Cystic change was noted in the second proximal phalanx.  No intertarsal, tibiotalar or subtalar joint space narrowing was noted.  No radiographic progression was noted when compared to the x-rays of 2020. Impression: These findings are consistent with rheumatoid arthritis and osteoarthritis overlap.  XR Foot 2 Views Right  Result  Date: 01/02/2021 PIP and DIP narrowing was noted.  No MTP, intertarsal, tibiotalar or subtalar joint space narrowing was noted.  Anderosion was noted over the right fifth MTP joint which is unchanged from her previous x-rays of 2020. Impression: These findings are consistent with rheumatoid arthritis and osteoarthritis overlap.  XR Hand 2 View Left  Result Date: 01/02/2021 Juxta-articular osteopenia was noted.  PIP and DIP narrowing was noted.  No MCP, intercarpal or radiocarpal joint space narrowing was noted.  No erosive changes were noted. 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  XR Hand 2 View Right  Result Date: 01/02/2021 Juxta-articular osteopenia was noted.  No MCP, intercarpal or radiocarpal joint space narrowing was noted.  No erosive changes were noted.No radiographic progression was noted when compared to the x-rays of 2020. Impression: These findings are consistent with rheumatoid arthritis and osteoarthritis overlap.     Recent Labs: Lab Results  Component Value Date   WBC 4.6 09/24/2020   HGB 8.6 (L) 09/24/2020   PLT 253 09/24/2020   NA  138 09/24/2020   K 3.8 09/24/2020   CL 107 09/24/2020   CO2 27 09/24/2020   GLUCOSE 78 09/24/2020   BUN 8 09/24/2020   CREATININE 0.59 09/24/2020   BILITOT 0.4 09/24/2020   ALKPHOS 42 08/24/2018   AST 12 09/24/2020   ALT 6 09/24/2020   PROT 6.9 09/24/2020   ALBUMIN 4.6 08/24/2018   CALCIUM 9.3 09/24/2020   GFRAA 127 09/24/2020   QFTBGOLD NEGATIVE 12/30/2016    Speciality Comments: PLQ Eye Exam: 09/16/2020 WNL @ [facility]  Procedures:  No procedures performed Allergies: Patient has no known allergies.    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  At her last office  visit on 09/24/2020 she was restarted on Plaquenil 200 mg 1 tablet by mouth twice daily Monday through Friday.  She has not been compliant taking Plaquenil as prescribed and often misses several days per week.  She experiences intermittent pain and inflammation of both ankle joints especially the left ankle joint.  Her discomfort is exacerbated by walking long distances.  She has not been taking any over-the-counter products for pain relief.  She has not had any nocturnal pain or morning stiffness.  X-rays of both hands and both feet were updated today to assess for radiographic progression.  We will also check a sed rate.  Discussed the importance of remaining compliant taking Plaquenil as prescribed.  Discussed that due to the erosion in her right fifth MTP joint we recommend treatment with methotrexate but she declined at this time.  She would like to try Plaquenil on a consistent basis before trying any other medications.  She will follow-up in the office in 3 months to assess her response to Plaquenil.- Plan: Sedimentation rate, XR Hand 2 View Right, XR Hand 2 View Left, XR Foot 2 Views Right, XR Foot 2 Views Left  High risk medication use - Plaquenil 200 mg 1 tablet by mouth twice daily Monday through Friday.   PLQ Eye Exam: 09/16/2020 WNL @ [facility].  CBC and CMP were updated on 09/24/2020.  CBC and CMP will be drawn today to monitor for drug toxicity.  Next lab work will be due in 3 months then every 5 months.- Plan: COMPLETE METABOLIC PANEL WITH GFR, CBC with Differential/Platelet  History of anemia: Hemoglobin was 8.6 and hematocrit was 30.7 on 09/24/2020.  CBC ordered today.  Tobacco abuse  Orders: Orders Placed This Encounter  Procedures   XR Hand 2 View Right   XR Hand 2 View Left   XR Foot 2 Views Right   XR Foot 2 Views Left   COMPLETE METABOLIC PANEL WITH GFR   CBC with Differential/Platelet   Sedimentation rate   No orders of the defined types were placed in this  encounter.    Follow-Up Instructions: Return in about 3 months (around 04/03/2021) for Rheumatoid arthritis, Osteoarthritis.   Ofilia Neas, PA-C  Note - This record has been created using Dragon software.  Chart creation errors have been sought, but may not always  have been located. Such creation errors do not reflect on  the standard of medical care.

## 2021-01-02 ENCOUNTER — Ambulatory Visit: Payer: Self-pay

## 2021-01-02 ENCOUNTER — Encounter: Payer: Self-pay | Admitting: Physician Assistant

## 2021-01-02 ENCOUNTER — Ambulatory Visit (INDEPENDENT_AMBULATORY_CARE_PROVIDER_SITE_OTHER): Payer: BC Managed Care – PPO | Admitting: Physician Assistant

## 2021-01-02 ENCOUNTER — Other Ambulatory Visit: Payer: Self-pay

## 2021-01-02 VITALS — BP 114/63 | HR 81 | Resp 14 | Ht 67.0 in | Wt 147.0 lb

## 2021-01-02 DIAGNOSIS — Z79899 Other long term (current) drug therapy: Secondary | ICD-10-CM | POA: Diagnosis not present

## 2021-01-02 DIAGNOSIS — M79641 Pain in right hand: Secondary | ICD-10-CM

## 2021-01-02 DIAGNOSIS — M79642 Pain in left hand: Secondary | ICD-10-CM

## 2021-01-02 DIAGNOSIS — Z720 Tobacco use: Secondary | ICD-10-CM | POA: Diagnosis not present

## 2021-01-02 DIAGNOSIS — Z862 Personal history of diseases of the blood and blood-forming organs and certain disorders involving the immune mechanism: Secondary | ICD-10-CM | POA: Diagnosis not present

## 2021-01-02 DIAGNOSIS — M79671 Pain in right foot: Secondary | ICD-10-CM | POA: Diagnosis not present

## 2021-01-02 DIAGNOSIS — M79672 Pain in left foot: Secondary | ICD-10-CM

## 2021-01-02 DIAGNOSIS — M0579 Rheumatoid arthritis with rheumatoid factor of multiple sites without organ or systems involvement: Secondary | ICD-10-CM | POA: Diagnosis not present

## 2021-01-02 LAB — COMPLETE METABOLIC PANEL WITH GFR
AG Ratio: 1.7 (calc) (ref 1.0–2.5)
ALT: 12 U/L (ref 6–29)
AST: 22 U/L (ref 10–35)
Albumin: 4.2 g/dL (ref 3.6–5.1)
Alkaline phosphatase (APISO): 42 U/L (ref 31–125)
BUN: 12 mg/dL (ref 7–25)
CO2: 25 mmol/L (ref 20–32)
Calcium: 9.2 mg/dL (ref 8.6–10.2)
Chloride: 107 mmol/L (ref 98–110)
Creat: 0.66 mg/dL (ref 0.50–0.99)
Globulin: 2.5 g/dL (calc) (ref 1.9–3.7)
Glucose, Bld: 80 mg/dL (ref 65–99)
Potassium: 4.6 mmol/L (ref 3.5–5.3)
Sodium: 138 mmol/L (ref 135–146)
Total Bilirubin: 0.2 mg/dL (ref 0.2–1.2)
Total Protein: 6.7 g/dL (ref 6.1–8.1)
eGFR: 109 mL/min/{1.73_m2} (ref >=60)

## 2021-01-02 LAB — SEDIMENTATION RATE: Sed Rate: 14 mm/h (ref 0–20)

## 2021-01-02 LAB — CBC WITH DIFFERENTIAL/PLATELET
Absolute Monocytes: 640 cells/uL (ref 200–950)
Basophils Absolute: 53 cells/uL (ref 0–200)
Basophils Relative: 0.8 %
Eosinophils Absolute: 79 cells/uL (ref 15–500)
Eosinophils Relative: 1.2 %
HCT: 30 % — ABNORMAL LOW (ref 35.0–45.0)
Hemoglobin: 8.4 g/dL — ABNORMAL LOW (ref 11.7–15.5)
Lymphs Abs: 1254 cells/uL (ref 850–3900)
MCH: 21.3 pg — ABNORMAL LOW (ref 27.0–33.0)
MCHC: 28 g/dL — ABNORMAL LOW (ref 32.0–36.0)
MCV: 76.1 fL — ABNORMAL LOW (ref 80.0–100.0)
MPV: 9.5 fL (ref 7.5–12.5)
Monocytes Relative: 9.7 %
Neutro Abs: 4574 cells/uL (ref 1500–7800)
Neutrophils Relative %: 69.3 %
Platelets: 396 10*3/uL (ref 140–400)
RBC: 3.94 10*6/uL (ref 3.80–5.10)
RDW: 16.2 % — ABNORMAL HIGH (ref 11.0–15.0)
Total Lymphocyte: 19 %
WBC: 6.6 10*3/uL (ref 3.8–10.8)

## 2021-01-03 ENCOUNTER — Telehealth: Payer: Self-pay | Admitting: *Deleted

## 2021-01-03 DIAGNOSIS — Z862 Personal history of diseases of the blood and blood-forming organs and certain disorders involving the immune mechanism: Secondary | ICD-10-CM

## 2021-01-03 DIAGNOSIS — M0579 Rheumatoid arthritis with rheumatoid factor of multiple sites without organ or systems involvement: Secondary | ICD-10-CM

## 2021-01-03 NOTE — Telephone Encounter (Signed)
-----   Message from Bo Merino, MD sent at 01/03/2021  1:20 PM EDT ----- Please refer her to a PCP.  Patient also refer her to a hematology for the evaluation of anemia.

## 2021-01-03 NOTE — Progress Notes (Signed)
Please refer her to a PCP.  Patient also refer her to a hematology for the evaluation of anemia.

## 2021-01-03 NOTE — Progress Notes (Signed)
CMP and sed rate are normal.  Hemoglobin is low.  Please forward results to patient's PCP.  She may need evaluation by hematology.

## 2021-02-04 ENCOUNTER — Other Ambulatory Visit: Payer: Self-pay

## 2021-02-04 ENCOUNTER — Other Ambulatory Visit: Payer: Self-pay | Admitting: Nurse Practitioner

## 2021-02-04 DIAGNOSIS — Z862 Personal history of diseases of the blood and blood-forming organs and certain disorders involving the immune mechanism: Secondary | ICD-10-CM

## 2021-02-13 NOTE — Progress Notes (Signed)
Virtual Visit via Telephone Note  I connected with Alison Lowery, on 02/14/2021 at 2:55 PM by telephone due to the COVID-19 pandemic and verified that I am speaking with the correct person using two identifiers.  Due to current restrictions/limitations of in-office visits due to the COVID-19 pandemic, this scheduled clinical appointment was converted to a telehealth visit.   Consent: I discussed the limitations, risks, security and privacy concerns of performing an evaluation and management service by telephone and the availability of in person appointments. I also discussed with the patient that there may be a patient responsible charge related to this service. The patient expressed understanding and agreed to proceed.   Location of Patient: Home  Location of Provider: [facility] participating in Telemedicine visit: Maurie Burandt Durene Fruits, NP Elmon Else, CMA  History of Present Illness: Alison Lowery is a 45 year-old female who presents to establish care.   Current issues and/or concerns: None    Past Medical History:  Diagnosis Date   Arthritis    Ectopic pregnancy    Rheumatoid arthritis ([place])    No Known Allergies  Current Outpatient Medications on File Prior to Visit  Medication Sig Dispense Refill   bacitracin-polymyxin b (POLYSPORIN) ophthalmic ointment SMARTSIG:Sparingly Left Eye Every Night (Patient not taking: Reported on 01/02/2021)     doxycycline (VIBRA-TABS) 100 MG tablet SMARTSIG:1 Tablet(s) By Mouth Every Evening (Patient not taking: Reported on 01/02/2021)     hydroxychloroquine (PLAQUENIL) 200 MG tablet Take 1 tablet by mouth twice daily, Monday through Friday only. 120 tablet 0   moxifloxacin (VIGAMOX) 0.5 % ophthalmic solution Place 1 drop into the left eye 4 (four) times daily. (Patient not taking: Reported on 01/02/2021)     No current facility-administered medications on file prior to visit.     Observations/Objective: Alert and oriented x 3. Not in acute distress. Physical examination not completed as this is a telemedicine visit.  Assessment and Plan: 1. Encounter to establish care: - Patient presents today to establish care.  - Return for annual physical examination, labs, and health maintenance. Arrive fasting meaning having no food for at least 8 hours prior to appointment. You may have only water or black coffee. Please take scheduled medications as normal.   Follow Up Instructions: Return for annual physical exam.    Patient was given clear instructions to go to Emergency Department or return to medical center if symptoms don't improve, worsen, or new problems develop.The patient verbalized understanding.  I discussed the assessment and treatment plan with the patient. The patient was provided an opportunity to ask questions and all were answered. The patient agreed with the plan and demonstrated an understanding of the instructions.   The patient was advised to call back or seek an in-person evaluation if the symptoms worsen or if the condition fails to improve as anticipated.    I provided 5 minutes total of non-face-to-face time during this encounter.   Camillia Herter, NP  [facility] [place], [place] 02/14/2021, 2:55 PM

## 2021-02-14 ENCOUNTER — Inpatient Hospital Stay: Payer: BC Managed Care – PPO | Attending: Nurse Practitioner

## 2021-02-14 ENCOUNTER — Other Ambulatory Visit: Payer: Self-pay

## 2021-02-14 ENCOUNTER — Ambulatory Visit (INDEPENDENT_AMBULATORY_CARE_PROVIDER_SITE_OTHER): Payer: BC Managed Care – PPO | Admitting: Family

## 2021-02-14 DIAGNOSIS — R1909 Other intra-abdominal and pelvic swelling, mass and lump: Secondary | ICD-10-CM | POA: Insufficient documentation

## 2021-02-14 DIAGNOSIS — M069 Rheumatoid arthritis, unspecified: Secondary | ICD-10-CM | POA: Insufficient documentation

## 2021-02-14 DIAGNOSIS — Z7689 Persons encountering health services in other specified circumstances: Secondary | ICD-10-CM | POA: Diagnosis not present

## 2021-02-14 DIAGNOSIS — D649 Anemia, unspecified: Secondary | ICD-10-CM | POA: Insufficient documentation

## 2021-02-15 NOTE — Progress Notes (Signed)
Erroneous encounter

## 2021-02-17 ENCOUNTER — Other Ambulatory Visit: Payer: Self-pay | Admitting: Nurse Practitioner

## 2021-02-17 ENCOUNTER — Telehealth: Payer: Self-pay | Admitting: *Deleted

## 2021-02-17 DIAGNOSIS — D509 Iron deficiency anemia, unspecified: Secondary | ICD-10-CM

## 2021-02-17 NOTE — Progress Notes (Addendum)
New Hematology/Oncology Consult   Requesting MD: Dr. Bo Merino  [phone]  Reason for Consult: History of anemia  HPI: Alison Lowery is a 45 year old woman with rheumatoid arthritis involving multiple sites referred for evaluation of anemia.  She had labs done 01/02/2021 at which time the hemoglobin was low at 8.4, MCV 76, RDW 16.2, normal white count and platelets; sed rate in normal range at 14; unremarkable chemistry panel.  Review of labs in the EMR 02/27/2019-hemoglobin 9.7, MCV 76; 08/24/2018 hemoglobin 9.2, MCV 75; 11/25/2017 hemoglobin 8.4, MCV 74; 04/29/2017 hemoglobin 10.5, MCV 86; 04/08/2017 hemoglobin 10.4, MCV 89; 12/30/2016 hemoglobin 11, MCV 86; 02/21/2013 hemoglobin 12.4, MCV 92; 05/07/2010 hemoglobin 12.2, MCV 90; 09/17/2009 hemoglobin 12.1, MCV 93.  She has bleeding related to a monthly menstrual cycle.  In general the bleeding is not heavy.  She did note prolonged bleeding with her cycle a few months ago.  She is not aware of any other bleeding.  Specifically no hematuria and no bloody or black bowel movements.  She reports having a colonoscopy about 3 years ago due to concern of blood in her stool.  She reports a colonoscopy was negative.  She has had no change in bowel habits.  She eats a regular diet.  She craves almonds.  No nail changes.  No tongue or mouth soreness.  No fever.  She notes sweating with her menstrual cycle.  Appetite has been poor the past few days.  No weight loss.  She has pain involving multiple joints related to rheumatoid arthritis.  She also reports recent pain of a toenail.  She is concerned for an ingrown nail.  She currently has a cough which she attributes to allergies, cold.  No shortness of breath.   Past Medical History:  Diagnosis Date   Arthritis    Ectopic pregnancy    Rheumatoid arthritis ([place])     No past surgical history on file.:   Current Outpatient Medications:    bacitracin-polymyxin b (POLYSPORIN) ophthalmic ointment,  SMARTSIG:Sparingly Left Eye Every Night (Patient not taking: Reported on 01/02/2021), Disp: , Rfl:    doxycycline (VIBRA-TABS) 100 MG tablet, SMARTSIG:1 Tablet(s) By Mouth Every Evening (Patient not taking: Reported on 01/02/2021), Disp: , Rfl:    hydroxychloroquine (PLAQUENIL) 200 MG tablet, Take 1 tablet by mouth twice daily, Monday through Friday only., Disp: 120 tablet, Rfl: 0   moxifloxacin (VIGAMOX) 0.5 % ophthalmic solution, Place 1 drop into the left eye 4 (four) times daily. (Patient not taking: Reported on 01/02/2021), Disp: , Rfl: :   No Known Allergies:  FH: No family history of anemia.  She is not aware of thalassemia or sickle cell disease.  No family history of malignancy.  SOCIAL HISTORY: She lives in [place].  She is single.  She has 2 adult children reported to be in good health.  She works in collections.  She smokes 1/2 pack of cigarettes per day.  Occasional alcohol intake.  Review of Systems:  She has bleeding related to a monthly menstrual cycle.  In general the bleeding is not heavy.  She did note prolonged bleeding with her cycle a few months ago.  She is not aware of any other bleeding.  Specifically no hematuria and no bloody or black bowel movements.  She reports having a colonoscopy about 3 years ago due to concern of blood in her stool.  She reports the colonoscopy was negative.  She has had no change in bowel habits.  She eats a regular diet.  She  craves almonds.  No nail changes.  No tongue or mouth soreness.  No fever.  She notes sweating with her menstrual cycle.  Appetite has been poor the past few days.  No weight loss.  She has pain involving multiple joints related to rheumatoid arthritis.  She also reports recent pain of a toenail.  She is concerned for an ingrown nail.  She currently has a cough which she attributes to allergies, cold.  No shortness of breath.  Over the past few months she has noted a fullness at the lower abdomen.  She denies history of uterine  fibroids.  She intermittently notes a "cyst" in the right breast, gets larger during her menstrual cycle  Physical Exam:  Blood pressure 108/73, pulse 87, temperature 98 F (36.7 C), temperature source Oral, resp. rate 20, height 5\' 7"  (1.702 m), weight 147 lb 12.8 oz (67 kg), SpO2 100 %.  HEENT: No thrush or ulcers. Lungs: Lungs clear bilaterally. Cardiac: Regular rate and rhythm. Abdomen: No hepatosplenomegaly.  Firm masslike fullness mid to low abdomen. Vascular: No leg edema. Lymph nodes: Shotty left scalene node.  No other palpable cervical, supraclavicular, axillary or inguinal lymph nodes. Breast: No mass palpated in either breast. Neurologic: Alert and oriented.  Follows commands. Skin: No rash.  LABS:   Recent Labs    02/19/21 1109  WBC 5.8  HGB 8.0*  HCT 28.6*  PLT 368  Peripheral blood smear-Red blood cells hypochromic, marked variation red cell size and shape, microcytes, ovalocytes, few targets and few teardrops, rare nucleated red cell; white blood cells appear normal; platelets normal in number, few scattered large platelets.  RADIOLOGY:  No results found.  Assessment and Plan:   Microcytic anemia Rheumatoid arthritis Palpable masslike fullness low abdomen/pelvis  [name] has been referred for evaluation of anemia.  She has a microcytic anemia, likely iron deficiency.  Ferritin level is pending.  Only bleeding is related to her monthly menstrual cycle which she does not characterize as particularly heavy.  On exam today she has a palpable masslike fullness at the low abdomen/pelvis.  We are referring her for CT scans.  She will return for follow-up next week to review the results.  Patient seen with Dr. Benay Spice.  Ned Card, NP 02/19/2021, 12:51 PM   This was a shared visit with Ned Card.  Alison Lowery was interviewed and examined.  I reviewed the peripheral blood smear.  She is referred for evaluation of microcytic anemia.  The hematologic indices  and peripheral blood smear are consistent with a diagnosis of iron deficiency anemia.  The iron deficiency anemia may be related to menstrual blood loss, but she has a low abdomen/pelvic mass on exam.  She will be referred for a diagnostic CT and return for an office visit next week.  I was present for greater than 50% of today's visit.  I performed medical decision making.  Julieanne Manson, MD

## 2021-02-18 ENCOUNTER — Encounter: Payer: BC Managed Care – PPO | Admitting: Family

## 2021-02-18 ENCOUNTER — Ambulatory Visit: Payer: BC Managed Care – PPO | Admitting: Nurse Practitioner

## 2021-02-18 DIAGNOSIS — Z1211 Encounter for screening for malignant neoplasm of colon: Secondary | ICD-10-CM

## 2021-02-18 DIAGNOSIS — Z1329 Encounter for screening for other suspected endocrine disorder: Secondary | ICD-10-CM

## 2021-02-18 DIAGNOSIS — Z130 Encounter for screening for diseases of the blood and blood-forming organs and certain disorders involving the immune mechanism: Secondary | ICD-10-CM

## 2021-02-18 DIAGNOSIS — Z124 Encounter for screening for malignant neoplasm of cervix: Secondary | ICD-10-CM

## 2021-02-18 DIAGNOSIS — Z113 Encounter for screening for infections with a predominantly sexual mode of transmission: Secondary | ICD-10-CM

## 2021-02-18 DIAGNOSIS — Z131 Encounter for screening for diabetes mellitus: Secondary | ICD-10-CM

## 2021-02-18 DIAGNOSIS — Z13220 Encounter for screening for lipoid disorders: Secondary | ICD-10-CM

## 2021-02-18 DIAGNOSIS — Z13228 Encounter for screening for other metabolic disorders: Secondary | ICD-10-CM

## 2021-02-18 DIAGNOSIS — Z0000 Encounter for general adult medical examination without abnormal findings: Secondary | ICD-10-CM

## 2021-02-19 ENCOUNTER — Other Ambulatory Visit (HOSPITAL_BASED_OUTPATIENT_CLINIC_OR_DEPARTMENT_OTHER): Payer: Self-pay

## 2021-02-19 ENCOUNTER — Inpatient Hospital Stay: Payer: BC Managed Care – PPO

## 2021-02-19 ENCOUNTER — Telehealth: Payer: Self-pay | Admitting: *Deleted

## 2021-02-19 ENCOUNTER — Inpatient Hospital Stay (HOSPITAL_BASED_OUTPATIENT_CLINIC_OR_DEPARTMENT_OTHER): Payer: BC Managed Care – PPO | Admitting: Nurse Practitioner

## 2021-02-19 ENCOUNTER — Encounter: Payer: Self-pay | Admitting: Nurse Practitioner

## 2021-02-19 ENCOUNTER — Other Ambulatory Visit: Payer: Self-pay

## 2021-02-19 VITALS — BP 108/73 | HR 87 | Temp 98.0°F | Resp 20 | Ht 67.0 in | Wt 147.8 lb

## 2021-02-19 DIAGNOSIS — Z862 Personal history of diseases of the blood and blood-forming organs and certain disorders involving the immune mechanism: Secondary | ICD-10-CM

## 2021-02-19 DIAGNOSIS — D649 Anemia, unspecified: Secondary | ICD-10-CM | POA: Diagnosis not present

## 2021-02-19 DIAGNOSIS — M069 Rheumatoid arthritis, unspecified: Secondary | ICD-10-CM | POA: Diagnosis not present

## 2021-02-19 DIAGNOSIS — R1909 Other intra-abdominal and pelvic swelling, mass and lump: Secondary | ICD-10-CM | POA: Diagnosis not present

## 2021-02-19 DIAGNOSIS — F17210 Nicotine dependence, cigarettes, uncomplicated: Secondary | ICD-10-CM

## 2021-02-19 DIAGNOSIS — D509 Iron deficiency anemia, unspecified: Secondary | ICD-10-CM

## 2021-02-19 DIAGNOSIS — R1900 Intra-abdominal and pelvic swelling, mass and lump, unspecified site: Secondary | ICD-10-CM

## 2021-02-19 DIAGNOSIS — M0579 Rheumatoid arthritis with rheumatoid factor of multiple sites without organ or systems involvement: Secondary | ICD-10-CM | POA: Diagnosis not present

## 2021-02-19 LAB — CBC WITH DIFFERENTIAL (CANCER CENTER ONLY)
Abs Immature Granulocytes: 0.01 10*3/uL (ref 0.00–0.07)
Basophils Absolute: 0.1 10*3/uL (ref 0.0–0.1)
Basophils Relative: 1 %
Eosinophils Absolute: 0.1 10*3/uL (ref 0.0–0.5)
Eosinophils Relative: 2 %
HCT: 28.6 % — ABNORMAL LOW (ref 36.0–46.0)
Hemoglobin: 8 g/dL — ABNORMAL LOW (ref 12.0–15.0)
Immature Granulocytes: 0 %
Lymphocytes Relative: 16 %
Lymphs Abs: 0.9 10*3/uL (ref 0.7–4.0)
MCH: 20.2 pg — ABNORMAL LOW (ref 26.0–34.0)
MCHC: 28 g/dL — ABNORMAL LOW (ref 30.0–36.0)
MCV: 72 fL — ABNORMAL LOW (ref 80.0–100.0)
Monocytes Absolute: 0.7 10*3/uL (ref 0.1–1.0)
Monocytes Relative: 13 %
Neutro Abs: 3.9 10*3/uL (ref 1.7–7.7)
Neutrophils Relative %: 68 %
Platelet Count: 368 10*3/uL (ref 150–400)
RBC: 3.97 MIL/uL (ref 3.87–5.11)
RDW: 18.5 % — ABNORMAL HIGH (ref 11.5–15.5)
WBC Count: 5.8 10*3/uL (ref 4.0–10.5)
nRBC: 0 % (ref 0.0–0.2)

## 2021-02-19 LAB — CMP (CANCER CENTER ONLY)
ALT: 8 U/L (ref 0–44)
AST: 15 U/L (ref 15–41)
Albumin: 4.3 g/dL (ref 3.5–5.0)
Alkaline Phosphatase: 43 U/L (ref 38–126)
Anion gap: 9 (ref 5–15)
BUN: 10 mg/dL (ref 6–20)
CO2: 25 mmol/L (ref 22–32)
Calcium: 9.5 mg/dL (ref 8.9–10.3)
Chloride: 106 mmol/L (ref 98–111)
Creatinine: 0.59 mg/dL (ref 0.44–1.00)
GFR, Estimated: >60 mL/min (ref >60)
Glucose, Bld: 89 mg/dL (ref 70–99)
Potassium: 4.2 mmol/L (ref 3.5–5.1)
Sodium: 140 mmol/L (ref 135–145)
Total Bilirubin: 0.3 mg/dL (ref 0.3–1.2)
Total Protein: 7.1 g/dL (ref 6.5–8.1)

## 2021-02-19 LAB — FERRITIN: Ferritin: 3 ng/mL — ABNORMAL LOW (ref 11–307)

## 2021-02-19 LAB — SAVE SMEAR(SSMR), FOR PROVIDER SLIDE REVIEW

## 2021-02-19 NOTE — Telephone Encounter (Signed)
Scheduled CT scan at [facility] for 11/11 at 3:15/3:30 and NPO 4 hours prior. Oral contrast at 1:30 and 2:30. Patient verbalized understanding. Managed care notified to obtain PA.

## 2021-02-19 NOTE — Addendum Note (Signed)
Addended by: Owens Shark on: 02/19/2021 03:27 PM   Modules accepted: Orders

## 2021-02-20 ENCOUNTER — Other Ambulatory Visit: Payer: Self-pay | Admitting: Nurse Practitioner

## 2021-02-20 ENCOUNTER — Inpatient Hospital Stay: Payer: BC Managed Care – PPO

## 2021-02-20 ENCOUNTER — Telehealth: Payer: Self-pay | Admitting: *Deleted

## 2021-02-20 DIAGNOSIS — R1900 Intra-abdominal and pelvic swelling, mass and lump, unspecified site: Secondary | ICD-10-CM

## 2021-02-20 DIAGNOSIS — D509 Iron deficiency anemia, unspecified: Secondary | ICD-10-CM

## 2021-02-20 DIAGNOSIS — Z862 Personal history of diseases of the blood and blood-forming organs and certain disorders involving the immune mechanism: Secondary | ICD-10-CM

## 2021-02-21 ENCOUNTER — Ambulatory Visit (HOSPITAL_BASED_OUTPATIENT_CLINIC_OR_DEPARTMENT_OTHER)
Admission: RE | Admit: 2021-02-21 | Discharge: 2021-02-21 | Disposition: A | Discharge status: Home / Self Care | Payer: BC Managed Care – PPO | Source: Ambulatory Visit | Attending: Nurse Practitioner | Admitting: Nurse Practitioner

## 2021-02-21 ENCOUNTER — Other Ambulatory Visit: Payer: Self-pay

## 2021-02-21 ENCOUNTER — Inpatient Hospital Stay: Payer: BC Managed Care – PPO

## 2021-02-21 ENCOUNTER — Telehealth: Payer: Self-pay | Admitting: *Deleted

## 2021-02-21 DIAGNOSIS — Z862 Personal history of diseases of the blood and blood-forming organs and certain disorders involving the immune mechanism: Secondary | ICD-10-CM

## 2021-02-21 DIAGNOSIS — R1900 Intra-abdominal and pelvic swelling, mass and lump, unspecified site: Secondary | ICD-10-CM

## 2021-02-21 DIAGNOSIS — D259 Leiomyoma of uterus, unspecified: Secondary | ICD-10-CM | POA: Diagnosis not present

## 2021-02-21 DIAGNOSIS — N8500 Endometrial hyperplasia, unspecified: Secondary | ICD-10-CM | POA: Diagnosis not present

## 2021-02-21 DIAGNOSIS — R911 Solitary pulmonary nodule: Secondary | ICD-10-CM | POA: Diagnosis not present

## 2021-02-21 DIAGNOSIS — J439 Emphysema, unspecified: Secondary | ICD-10-CM | POA: Diagnosis not present

## 2021-02-21 DIAGNOSIS — D509 Iron deficiency anemia, unspecified: Secondary | ICD-10-CM

## 2021-02-21 DIAGNOSIS — K5730 Diverticulosis of large intestine without perforation or abscess without bleeding: Secondary | ICD-10-CM | POA: Diagnosis not present

## 2021-02-21 LAB — URINALYSIS, COMPLETE (UACMP) WITH MICROSCOPIC
Bilirubin Urine: NEGATIVE
Glucose, UA: NEGATIVE mg/dL
Ketones, ur: NEGATIVE mg/dL
Leukocytes,Ua: NEGATIVE
Nitrite: NEGATIVE
Specific Gravity, Urine: 1.02 (ref 1.005–1.030)
pH: 5.5 (ref 5.0–8.0)

## 2021-02-21 LAB — PREGNANCY, URINE: Preg Test, Ur: NEGATIVE

## 2021-02-21 MED ORDER — IOHEXOL 300 MG/ML  SOLN
100.0000 mL | Freq: Once | INTRAMUSCULAR | Status: AC | PRN
  Administered 2021-02-21: 100 mL via INTRAVENOUS

## 2021-02-25 ENCOUNTER — Telehealth: Payer: Self-pay | Admitting: *Deleted

## 2021-02-25 NOTE — Telephone Encounter (Signed)
Patient instructed that CT scan showed fibroids. Patient instructed to begin ferrous sulfate 325mg  po bid. Patient instructed to return on Friday 11/18 for her f/u appt with Lattie Haw to review labs/CT/ferrous sulfate recommendation. Patient verbalized understanding of all instructions.

## 2021-02-28 ENCOUNTER — Other Ambulatory Visit: Payer: Self-pay

## 2021-02-28 ENCOUNTER — Encounter: Payer: Self-pay | Admitting: *Deleted

## 2021-02-28 ENCOUNTER — Encounter: Payer: Self-pay | Admitting: Nurse Practitioner

## 2021-02-28 ENCOUNTER — Inpatient Hospital Stay (HOSPITAL_BASED_OUTPATIENT_CLINIC_OR_DEPARTMENT_OTHER): Payer: BC Managed Care – PPO | Admitting: Nurse Practitioner

## 2021-02-28 VITALS — BP 115/77 | HR 87 | Temp 97.7°F | Resp 18 | Ht 67.0 in | Wt 151.2 lb

## 2021-02-28 DIAGNOSIS — D509 Iron deficiency anemia, unspecified: Secondary | ICD-10-CM | POA: Diagnosis not present

## 2021-02-28 DIAGNOSIS — R1909 Other intra-abdominal and pelvic swelling, mass and lump: Secondary | ICD-10-CM | POA: Diagnosis not present

## 2021-02-28 DIAGNOSIS — M069 Rheumatoid arthritis, unspecified: Secondary | ICD-10-CM | POA: Diagnosis not present

## 2021-02-28 DIAGNOSIS — D649 Anemia, unspecified: Secondary | ICD-10-CM | POA: Diagnosis not present

## 2021-02-28 NOTE — Progress Notes (Signed)
  [facility] OFFICE PROGRESS NOTE   Diagnosis: Iron deficiency anemia  INTERVAL HISTORY:   Alison Lowery returns as scheduled.  She began oral iron 2 days ago.  Tolerating well thus far.  Energy level remains poor.  Objective:  Vital signs in last 24 hours:  Blood pressure 115/77, pulse 87, temperature 97.7 F (36.5 C), temperature source Oral, resp. rate 18, height 5\' 7"  (1.702 m), weight 151 lb 3.2 oz (68.6 kg), SpO2 100 %.    HEENT: Conjunctival pallor. Resp: Lungs clear bilaterally. Cardio: Regular rate and rhythm. GI: No hepatosplenomegaly.  Masslike fullness mid to low abdomen. Vascular: No leg edema.    Lab Results:  Lab Results  Component Value Date   WBC 5.8 02/19/2021   HGB 8.0 (L) 02/19/2021   HCT 28.6 (L) 02/19/2021   MCV 72.0 (L) 02/19/2021   PLT 368 02/19/2021   NEUTROABS 3.9 02/19/2021    Imaging:  No results found.  Medications: I have reviewed the patient's current medications.  Assessment/Plan: Microcytic anemia 02/19/2021 ferritin 3 02/21/2021 urinalysis unremarkable Ferritin 325 mg twice daily beginning 02/26/2021 Stool cards provided 02/28/2021 (reports colonoscopy approximately 3 years ago) Rheumatoid arthritis Palpable masslike fullness low abdomen/pelvis CTs 02/21/2021 with enlarged uterus with multiple bulky fibroids CT chest 02/21/2021-small solid pulmonary nodule right upper lobe measuring 5 mm Noncontrast chest CT in 12 months CT chest 02/21/2021 enhancing lesion of the right breast measuring 8 mm referred for diagnostic mammogram 02/28/2021 Tobacco use Emphysema on chest CT 02/21/2021   Disposition: Alison Lowery appears stable.  She has iron deficiency anemia, ferritin 3 on 02/19/2021.  She will continue oral iron.  The iron deficiency is likely related to menstrual blood loss.  Urinalysis negative.  She will complete a set of stool cards.  She does not have a PCP.  She is interested in seeing a PCP at this location.   Referral made.  She was found to have multiple bulky fibroids on CTs 02/21/2021.  She does not have a gynecologist.  She would like to discuss with PCP prior to gynecology referral.  Other findings on the recent CTs include a right upper lobe lung nodule and changes of emphysema.  She is a smoker.  She will need a noncontrast chest CT in 12 months.  We discussed smoking cessation.  She would like to discuss this further with PCP.  She was noted to have a right breast lesion.  Referral made for a diagnostic mammogram.  She will return for a CBC/ferritin in 3 weeks.  Lab and follow-up in 6 weeks.  She will contact the office in the interim with any problems.    Ned Card ANP/GNP-BC   02/28/2021  12:18 PM

## 2021-02-28 NOTE — Progress Notes (Signed)
Faxed referral order to [facility] at [facility]. Need to establish primary care.

## 2021-03-11 ENCOUNTER — Other Ambulatory Visit: Payer: Self-pay | Admitting: Nurse Practitioner

## 2021-03-11 DIAGNOSIS — D509 Iron deficiency anemia, unspecified: Secondary | ICD-10-CM

## 2021-03-13 ENCOUNTER — Telehealth: Payer: Self-pay

## 2021-03-13 NOTE — Telephone Encounter (Signed)
I called the patient to made sure she had an appointment with the GYN. She was giving [facility] healthcare number at [facility] to schedule.

## 2021-03-20 DIAGNOSIS — Z0000 Encounter for general adult medical examination without abnormal findings: Secondary | ICD-10-CM | POA: Diagnosis not present

## 2021-03-20 DIAGNOSIS — I00 Rheumatic fever without heart involvement: Secondary | ICD-10-CM | POA: Diagnosis not present

## 2021-03-20 DIAGNOSIS — Z13220 Encounter for screening for lipoid disorders: Secondary | ICD-10-CM | POA: Diagnosis not present

## 2021-03-20 DIAGNOSIS — R911 Solitary pulmonary nodule: Secondary | ICD-10-CM | POA: Diagnosis not present

## 2021-03-20 DIAGNOSIS — Z202 Contact with and (suspected) exposure to infections with a predominantly sexual mode of transmission: Secondary | ICD-10-CM | POA: Diagnosis not present

## 2021-03-20 DIAGNOSIS — F17210 Nicotine dependence, cigarettes, uncomplicated: Secondary | ICD-10-CM | POA: Diagnosis not present

## 2021-03-20 DIAGNOSIS — D259 Leiomyoma of uterus, unspecified: Secondary | ICD-10-CM | POA: Diagnosis not present

## 2021-03-21 ENCOUNTER — Inpatient Hospital Stay: Payer: BC Managed Care – PPO

## 2021-03-21 DIAGNOSIS — N841 Polyp of cervix uteri: Secondary | ICD-10-CM | POA: Diagnosis not present

## 2021-03-21 DIAGNOSIS — Z124 Encounter for screening for malignant neoplasm of cervix: Secondary | ICD-10-CM | POA: Diagnosis not present

## 2021-03-21 DIAGNOSIS — L600 Ingrowing nail: Secondary | ICD-10-CM | POA: Diagnosis not present

## 2021-03-21 DIAGNOSIS — N760 Acute vaginitis: Secondary | ICD-10-CM | POA: Diagnosis not present

## 2021-03-21 NOTE — Progress Notes (Deleted)
Office Visit Note  Patient: Alison Lowery             Date of Birth: [birth year]           MRN: [id number]             PCP: Pcp, No Referring: No ref. provider found Visit Date: 04/03/2021 Occupation: @GUAROCC @  Subjective:  No chief complaint on file.   History of Present Illness: Alison Lowery is a 45 y.o. female ***   Activities of Daily Living:  Patient reports morning stiffness for *** {minute/hour:19697}.   Patient {ACTIONS;DENIES/REPORTS:21021675::"Denies"} nocturnal pain.  Difficulty dressing/grooming: {ACTIONS;DENIES/REPORTS:21021675::"Denies"} Difficulty climbing stairs: {ACTIONS;DENIES/REPORTS:21021675::"Denies"} Difficulty getting out of chair: {ACTIONS;DENIES/REPORTS:21021675::"Denies"} Difficulty using hands for taps, buttons, cutlery, and/or writing: {ACTIONS;DENIES/REPORTS:21021675::"Denies"}  No Rheumatology ROS completed.   PMFS History:  Patient Active Problem List   Diagnosis Date Noted  . Rheumatoid arthritis involving multiple sites with positive rheumatoid factor, positive anti-CCP, +14 33 eta 12/03/2016  . Elevated rheumatoid factor 11/18/2016  . Tobacco abuse 11/18/2016  . History of abnormal electrocardiogram 11/18/2016  . History of anemia 11/18/2016  . History of diarrhea 11/18/2016  . Breast mass, right 10/21/2010    Past Medical History:  Diagnosis Date  . Arthritis   . Ectopic pregnancy   . Rheumatoid arthritis ([place])     No family history on file. No past surgical history on file. Social History   Social History Narrative  . Not on file   There is no immunization history for the selected administration types on file for this patient.   Objective: Vital Signs: There were no vitals taken for this visit.   Physical Exam   Musculoskeletal Exam: ***  CDAI Exam: CDAI Score: -- Patient Global: --; Provider Global: -- Swollen: --; Tender: -- Joint Exam 04/03/2021   No joint exam has been documented for this visit   There is  currently no information documented on the homunculus. Go to the Rheumatology activity and complete the homunculus joint exam.  Investigation: No additional findings.  Imaging: CT CHEST ABDOMEN PELVIS W CONTRAST  Result Date: 02/23/2021 CLINICAL DATA:  Concern for abdominal/pelvic mass EXAM: CT CHEST, ABDOMEN, AND PELVIS WITH CONTRAST TECHNIQUE: Multidetector CT imaging of the chest, abdomen and pelvis was performed following the standard protocol during bolus administration of intravenous contrast. CONTRAST:  163mL OMNIPAQUE IOHEXOL 300 MG/ML  SOLN COMPARISON:  None. FINDINGS: CT CHEST FINDINGS Cardiovascular: Normal heart size. No coronary artery calcifications. Mild atherosclerotic disease of the thoracic aorta. No pericardial effusion. Mediastinum/Nodes: Esophagus and thyroid are unremarkable. No pathologically enlarged lymph nodes seen in the chest. No suspicious filling defects of the central pulmonary arteries. Lungs/Pleura: Central airways are patent. Mild upper lung predominant paraseptal emphysema. No consolidation, pleural effusion or pneumothorax. Small solid pulmonary nodule of the right upper lobe measuring 5 mm on series 4, image 89. Musculoskeletal: No chest wall mass or suspicious bone lesions identified. CT ABDOMEN PELVIS FINDINGS Hepatobiliary: No focal liver abnormality is seen. No gallstones, gallbladder wall thickening, or biliary dilatation. Pancreas: Unremarkable. No pancreatic ductal dilatation or surrounding inflammatory changes. Spleen: Normal in size without focal abnormality. Adrenals/Urinary Tract: Adrenal glands are unremarkable. Kidneys are normal, without renal calculi, focal lesion, or hydronephrosis. Bladder is unremarkable. Stomach/Bowel: Stomach is within normal limits. Appendix appears normal. Diverticulosis. No evidence of bowel wall thickening, distention, or inflammatory changes. Vascular/Lymphatic: Aortic atherosclerosis. No enlarged abdominal or pelvic lymph nodes.  Reproductive: Enlarged uterus which contains multiple bulky fibroids. Reference posterior fibroid measuring approximately 6.8 x 5.4 cm  on series 2, image 110 reference anterior fibroid measuring 7.2 x 6.7 cm on image 91. Other: No abdominal wall hernia or abnormality. No abdominopelvic ascites. Enhancing lesion of the right breast measuring 8 mm on series 2, image 32. Musculoskeletal: No acute or significant osseous findings. IMPRESSION: 1.   Enlarged uterus with multiple bulky fibroids. 2. Small solid pulmonary nodule of the right upper lobe measuring 5 mm. No follow-up needed if patient is low-risk. Non-contrast chest CT can be considered in 12 months if patient is high-risk. This recommendation follows the consensus statement: Guidelines for Management of Incidental Pulmonary Nodules Detected on CT Images: From the Fleischner Society 2017; Radiology 2017; 284:228-243. 3. Enhancing lesion of the right breast measuring 8 mm, possibly a fibroadenoma. Recommend further evaluation with mammography. 4. Aortic Atherosclerosis (ICD10-I70.0) and Emphysema (ICD10-J43.9). Electronically Signed   By: Yetta Glassman M.D.   On: 02/23/2021 15:57    Recent Labs: Lab Results  Component Value Date   WBC 5.8 02/19/2021   HGB 8.0 (L) 02/19/2021   PLT 368 02/19/2021   NA 140 02/19/2021   K 4.2 02/19/2021   CL 106 02/19/2021   CO2 25 02/19/2021   GLUCOSE 89 02/19/2021   BUN 10 02/19/2021   CREATININE 0.59 02/19/2021   BILITOT 0.3 02/19/2021   ALKPHOS 43 02/19/2021   AST 15 02/19/2021   ALT 8 02/19/2021   PROT 7.1 02/19/2021   ALBUMIN 4.3 02/19/2021   CALCIUM 9.5 02/19/2021   GFRAA 127 09/24/2020   QFTBGOLD NEGATIVE 12/30/2016    Speciality Comments: PLQ Eye Exam: 09/16/2020 WNL @ [facility]  Procedures:  No procedures performed Allergies: Patient has no known allergies.   Assessment / Plan:     Visit Diagnoses: No diagnosis found.  Orders: No orders of the defined types were placed in  this encounter.  No orders of the defined types were placed in this encounter.   Face-to-face time spent with patient was *** minutes. Greater than 50% of time was spent in counseling and coordination of care.  Follow-Up Instructions: No follow-ups on file.   Earnestine Mealing, CMA  Note - This record has been created using Editor, commissioning.  Chart creation errors have been sought, but may not always  have been located. Such creation errors do not reflect on  the standard of medical care.

## 2021-04-01 NOTE — Progress Notes (Deleted)
Patient ID: Alison Lowery, female    DOB: 06-19-1975  MRN: [id number]  CC: Annual Physical Exam  Subjective: Alison Lowery is a 45 y.o. female who presents for annual physical exam.  Her concerns today include:   NEED FLU NEED TDAP  NEED MAMMO NEED PAP NEED COLONOSCOPY  CMP 02/19/2021 CBC 02/19/2021   Patient Active Problem List   Diagnosis Date Noted   Rheumatoid arthritis involving multiple sites with positive rheumatoid factor, positive anti-CCP, +14 33 eta 12/03/2016   Elevated rheumatoid factor 11/18/2016   Tobacco abuse 11/18/2016   History of abnormal electrocardiogram 11/18/2016   History of anemia 11/18/2016   History of diarrhea 11/18/2016   Breast mass, right 10/21/2010     Current Outpatient Medications on File Prior to Visit  Medication Sig Dispense Refill   bacitracin-polymyxin b (POLYSPORIN) ophthalmic ointment SMARTSIG:Sparingly Left Eye Every Night (Patient not taking: Reported on 01/02/2021)     doxycycline (VIBRA-TABS) 100 MG tablet SMARTSIG:1 Tablet(s) By Mouth Every Evening (Patient not taking: Reported on 01/02/2021)     hydroxychloroquine (PLAQUENIL) 200 MG tablet Take 1 tablet by mouth twice daily, Monday through Friday only. 120 tablet 0   moxifloxacin (VIGAMOX) 0.5 % ophthalmic solution Place 1 drop into the left eye 4 (four) times daily. (Patient not taking: Reported on 01/02/2021)     No current facility-administered medications on file prior to visit.    No Known Allergies  Social History   Socioeconomic History   Marital status: Single    Spouse name: Not on file   Number of children: 2   Years of education: Not on file   Highest education level: Not on file  Occupational History   Not on file  Tobacco Use   Smoking status: Every Day    Packs/day: 0.50    Years: 15.00    Pack years: 7.50    Types: Cigarettes   Smokeless tobacco: Never  Vaping Use   Vaping Use: Never used  Substance and Sexual Activity   Alcohol use: Yes     Comment: occ   Drug use: No   Sexual activity: Yes    Partners: Male    Birth control/protection: None  Other Topics Concern   Not on file  Social History Narrative   Not on file   Social Determinants of Health   Financial Resource Strain: Not on file  Food Insecurity: Not on file  Transportation Needs: Not on file  Physical Activity: Not on file  Stress: Not on file  Social Connections: Not on file  Intimate Partner Violence: Not on file    No family history on file.  No past surgical history on file.  ROS: Review of Systems Negative except as stated above  PHYSICAL EXAM: There were no vitals taken for this visit.  Physical Exam  {female adult master:310786} {female adult master:310785}  CMP Latest Ref Rng & Units 02/19/2021 01/02/2021 09/24/2020  Glucose 70 - 99 mg/dL 89 80 78  BUN 6 - 20 mg/dL 10 12 8   Creatinine 0.44 - 1.00 mg/dL 0.59 0.66 0.59  Sodium 135 - 145 mmol/L 140 138 138  Potassium 3.5 - 5.1 mmol/L 4.2 4.6 3.8  Chloride 98 - 111 mmol/L 106 107 107  CO2 22 - 32 mmol/L 25 25 27   Calcium 8.9 - 10.3 mg/dL 9.5 9.2 9.3  Total Protein 6.5 - 8.1 g/dL 7.1 6.7 6.9  Total Bilirubin 0.3 - 1.2 mg/dL 0.3 0.2 0.4  Alkaline Phos 38 - 126 U/L 43 - -  AST 15 - 41 U/L 15 22 12   ALT 0 - 44 U/L 8 12 6    Lipid Panel     Component Value Date/Time   CHOL 143 08/24/2018 1542   TRIG 89 08/24/2018 1542   HDL 59 08/24/2018 1542   CHOLHDL 2.4 08/24/2018 1542   LDLCALC 66 08/24/2018 1542    CBC    Component Value Date/Time   WBC 5.8 02/19/2021 1109   WBC 6.6 01/02/2021 1022   RBC 3.97 02/19/2021 1109   HGB 8.0 (L) 02/19/2021 1109   HGB 9.2 (L) 08/24/2018 1542   HCT 28.6 (L) 02/19/2021 1109   HCT 30.2 (L) 08/24/2018 1542   PLT 368 02/19/2021 1109   PLT 366 08/24/2018 1542   MCV 72.0 (L) 02/19/2021 1109   MCV 75 (L) 08/24/2018 1542   MCH 20.2 (L) 02/19/2021 1109   MCHC 28.0 (L) 02/19/2021 1109   RDW 18.5 (H) 02/19/2021 1109   RDW 17.1 (H) 08/24/2018 1542    LYMPHSABS 0.9 02/19/2021 1109   LYMPHSABS 1.8 08/24/2018 1542   MONOABS 0.7 02/19/2021 1109   EOSABS 0.1 02/19/2021 1109   EOSABS 0.1 08/24/2018 1542   BASOSABS 0.1 02/19/2021 1109   BASOSABS 0.1 08/24/2018 1542    ASSESSMENT AND PLAN:  There are no diagnoses linked to this encounter.   Patient was given the opportunity to ask questions.  Patient verbalized understanding of the plan and was able to repeat key elements of the plan. Patient was given clear instructions to go to Emergency Department or return to medical center if symptoms don't improve, worsen, or new problems develop.The patient verbalized understanding.   No orders of the defined types were placed in this encounter.    Requested Prescriptions    No prescriptions requested or ordered in this encounter    No follow-ups on file.  Camillia Herter, NP

## 2021-04-03 ENCOUNTER — Ambulatory Visit: Payer: BC Managed Care – PPO | Admitting: Rheumatology

## 2021-04-03 DIAGNOSIS — Z720 Tobacco use: Secondary | ICD-10-CM

## 2021-04-03 DIAGNOSIS — M0579 Rheumatoid arthritis with rheumatoid factor of multiple sites without organ or systems involvement: Secondary | ICD-10-CM

## 2021-04-03 DIAGNOSIS — Z862 Personal history of diseases of the blood and blood-forming organs and certain disorders involving the immune mechanism: Secondary | ICD-10-CM

## 2021-04-03 DIAGNOSIS — Z79899 Other long term (current) drug therapy: Secondary | ICD-10-CM

## 2021-04-08 ENCOUNTER — Telehealth: Payer: Self-pay | Admitting: *Deleted

## 2021-04-08 ENCOUNTER — Inpatient Hospital Stay: Payer: BC Managed Care – PPO | Admitting: Oncology

## 2021-04-08 ENCOUNTER — Inpatient Hospital Stay: Payer: BC Managed Care – PPO | Attending: Nurse Practitioner

## 2021-04-08 NOTE — Telephone Encounter (Signed)
Patient was "no show" for lab/OV today. Attempted to reach her--no answer and no voice mail has been set up. Sent scheduling message to reschedule for 2-3 weeks. Sent MyChart message to patient as well.

## 2021-04-10 ENCOUNTER — Telehealth: Payer: Self-pay | Admitting: Oncology

## 2021-04-10 ENCOUNTER — Encounter: Payer: BC Managed Care – PPO | Admitting: Family

## 2021-04-10 NOTE — Telephone Encounter (Signed)
Contacted patient 12/28, in attempts to reschedule missed appts 12/27. Patient advised that she will be calling us next week to reschedule- Pt out of town.

## 2021-04-11 ENCOUNTER — Inpatient Hospital Stay: Payer: BC Managed Care – PPO | Admitting: Oncology

## 2021-04-11 ENCOUNTER — Inpatient Hospital Stay: Payer: BC Managed Care – PPO

## 2021-04-16 ENCOUNTER — Telehealth: Payer: Self-pay | Admitting: *Deleted

## 2021-04-16 NOTE — Telephone Encounter (Signed)
Attempted to reach patient to f/u on referral to PCP office at [facility] without success. Sent inquiry via [place].

## 2021-04-18 ENCOUNTER — Inpatient Hospital Stay: Admission: RE | Admit: 2021-04-18 | Payer: BC Managed Care – PPO | Source: Ambulatory Visit

## 2021-06-26 ENCOUNTER — Encounter (HOSPITAL_BASED_OUTPATIENT_CLINIC_OR_DEPARTMENT_OTHER): Payer: Self-pay | Admitting: Obstetrics & Gynecology

## 2021-06-26 ENCOUNTER — Other Ambulatory Visit: Payer: Self-pay

## 2021-06-26 ENCOUNTER — Ambulatory Visit (HOSPITAL_BASED_OUTPATIENT_CLINIC_OR_DEPARTMENT_OTHER): Payer: 59 | Admitting: Obstetrics & Gynecology

## 2021-06-26 VITALS — BP 108/77 | HR 97 | Ht 67.0 in | Wt 157.4 lb

## 2021-06-26 DIAGNOSIS — R1900 Intra-abdominal and pelvic swelling, mass and lump, unspecified site: Secondary | ICD-10-CM

## 2021-06-26 DIAGNOSIS — N841 Polyp of cervix uteri: Secondary | ICD-10-CM | POA: Diagnosis not present

## 2021-06-26 DIAGNOSIS — Z8759 Personal history of other complications of pregnancy, childbirth and the puerperium: Secondary | ICD-10-CM

## 2021-06-26 DIAGNOSIS — M0579 Rheumatoid arthritis with rheumatoid factor of multiple sites without organ or systems involvement: Secondary | ICD-10-CM | POA: Diagnosis not present

## 2021-06-26 DIAGNOSIS — D251 Intramural leiomyoma of uterus: Secondary | ICD-10-CM | POA: Diagnosis not present

## 2021-06-26 DIAGNOSIS — N920 Excessive and frequent menstruation with regular cycle: Secondary | ICD-10-CM

## 2021-06-26 DIAGNOSIS — D500 Iron deficiency anemia secondary to blood loss (chronic): Secondary | ICD-10-CM

## 2021-06-26 DIAGNOSIS — Z720 Tobacco use: Secondary | ICD-10-CM | POA: Diagnosis not present

## 2021-06-26 DIAGNOSIS — N898 Other specified noninflammatory disorders of vagina: Secondary | ICD-10-CM

## 2021-06-26 MED ORDER — METRONIDAZOLE 0.75 % VA GEL: 1.0000 | Freq: Every day | VAGINAL | 0 refills | Status: DC

## 2021-06-26 MED ORDER — NORETHINDRONE 0.35 MG PO TABS: 1.0000 | ORAL_TABLET | Freq: Every day | ORAL | 3 refills | Status: DC

## 2021-06-26 NOTE — Progress Notes (Signed)
GYNECOLOGY  VISIT ? ?CC:   discuss fibroid uterus seen on CT scan ? ?HPI: ?46 y.o. G83P2A1 Single Black or African American female here for discussion of fibroids noted on CT scan.  Pt has at least two large fibroids present measuring 6.8cm x 5.4cm and another measuring 7.2cm x 6.7cm.  she is having heavy bleeding that has been going on about a year at this point.  Flow lasts for more than a week and she can pass large clots as well.  Is taking oral iron.  Does have anemia as well.  Last hb was done with PCP.  Reports it was "a little better".  Last hb in Epic was 02/19/2021 and was 8.0.  ferritin was 3 at that point.  Pt and I discussed IV iron today.  She is very much open to having this done.  Has a lot of fatigue and hopes this could improve. ? ?Last pap was done with PCP at [facility].  I do not have these results but release signed today.  Pt reports she was told her pap was normal.  However, there was an "abnormal finding" on the cervix and she was advised to let me know about this as well.   ? ?Patient Active Problem List  ? Diagnosis Date Noted  ? Rheumatoid arthritis involving multiple sites with positive rheumatoid factor, positive anti-CCP, +14 33 eta 12/03/2016  ? Elevated rheumatoid factor 11/18/2016  ? Tobacco abuse 11/18/2016  ? History of abnormal electrocardiogram 11/18/2016  ? History of anemia 11/18/2016  ? History of diarrhea 11/18/2016  ? Breast mass, right 10/21/2010  ? ? ?Past Medical History:  ?Diagnosis Date  ? Arthritis   ? Ectopic pregnancy   ? Rheumatoid arthritis ([place])   ? ? ?No past surgical history on file. ? ?MEDS:   ?Current Outpatient Medications on File Prior to Visit  ?Medication Sig Dispense Refill  ? bacitracin-polymyxin b (POLYSPORIN) ophthalmic ointment     ? doxycycline (VIBRA-TABS) 100 MG tablet     ? hydroxychloroquine (PLAQUENIL) 200 MG tablet Take 1 tablet by mouth twice daily, Monday through Friday only. 120 tablet 0  ? moxifloxacin (VIGAMOX) 0.5 % ophthalmic solution Place 1  drop into the left eye 4 (four) times daily.    ? ?No current facility-administered medications on file prior to visit.  ? ? ?ALLERGIES: Patient has no known allergies. ? ?No family history on file. ? ?SH:  single, smoker ? ?Review of Systems  ?Constitutional:  Positive for malaise/fatigue.  ?Genitourinary:   ?     Menorrhagia  ? ?PHYSICAL EXAMINATION:   ? ?BP 108/77 (BP Location: Right Arm, Patient Position: Sitting, Cuff Size: Normal)   Pulse 97   Ht '5\' 7"'$  (1.702 m) Comment: reported  Wt 157 lb 6.4 oz (71.4 kg)   LMP 06/21/2021   BMI 24.65 kg/m?     ?General appearance: alert, cooperative and appears stated age ?CV:  Regular rate and rhythm ?Lungs:  clear to auscultation, no wheezes, rales or rhonchi, symmetric air entry ?Abdomen: soft, non-tender; bowel sounds normal; no masses,  pelvic mass felt at about 16 weeks, more on right, mildly tender to palpation ?Lymph:  no inguinal LAD noted ? ?Pelvic: External genitalia:  no lesions ?             Urethra:  normal appearing urethra with no masses, tenderness or lesions ?             Bartholins and Skenes: normal    ?  Vagina: normal appearing vagina with normal color and discharge, no lesions    ?             Cervix:  cervical polyp noted ?             Bimanual Exam:  Uterus:  enlarged, 16-18 weeks size, fills pelvis, difficult to lift or elevated in pelvis, pt very uncomfortable with exam ?             Adnexa: no mass, fullness, tenderness, difficult to clearly identify adnexal structures due to size of uterus ? ?Chaperone, Octaviano Batty, CMA, was present for exam. ? ?Assessment/Plan: ?1. Menorrhagia with regular cycle ?- treatment for fibroids/bleeding discussed.  Given size of uterus, hysterectomy likely with incision vs myomectomy vs [place] discussed.  Pt does not complain of bulk symptoms.  Bleeding seems to be her primary issue.  She is not interested in surgery at this time.  Referral for [place] will be made.  Will need MRI of pelvis ordered as  well ? ?2. Pelvic mass ?- recent CT showed uterine fibroids ? ?3. Iron deficiency anemia due to chronic blood loss ?- will start oral progesterone to see if can help bleeding ?- pt likely will benefit from iron infusion.  Will order blood work today. ?- CBC ?- Iron, TIBC and Ferritin Panel ?- norethindrone (MICRONOR) 0.35 MG tablet; Take 1 tablet (0.35 mg total) by mouth daily.  Dispense: 84 tablet; Refill: 3 ? ?4. Tobacco abuse ?- decreased smoking encouraged ? ?5. Rheumatoid arthritis involving multiple sites with positive rheumatoid factor, positive anti-CCP, +14 33 eta ? ?6. Cervical polyp ?- findings reviewed.  Will plan to remove before [place] is completed. ? ?7. History of ectopic pregnancy ?- s/p salpingectomy as well ? ?8. Vaginal discharge ?- metroNIDAZOLE (METROGEL) 0.75 % vaginal gel; Place 1 Applicatorful vaginally at bedtime. Use for 5 nights.  Dispense: 70 g; Refill: 0  ?

## 2021-06-27 LAB — IRON,TIBC AND FERRITIN PANEL
Ferritin: 7 ng/mL — ABNORMAL LOW (ref 15–150)
Iron Saturation: 2 % — CL (ref 15–55)
Iron: 9 ug/dL — CL (ref 27–159)
Total Iron Binding Capacity: 386 ug/dL (ref 250–450)
UIBC: 377 ug/dL (ref 131–425)

## 2021-06-27 LAB — CBC
Hematocrit: 30.1 % — ABNORMAL LOW (ref 34.0–46.6)
Hemoglobin: 8.9 g/dL — ABNORMAL LOW (ref 11.1–15.9)
MCH: 23.1 pg — ABNORMAL LOW (ref 26.6–33.0)
MCHC: 29.6 g/dL — ABNORMAL LOW (ref 31.5–35.7)
MCV: 78 fL — ABNORMAL LOW (ref 79–97)
Platelets: 317 10*3/uL (ref 150–450)
RBC: 3.85 x10E6/uL (ref 3.77–5.28)
RDW: 20.9 % — ABNORMAL HIGH (ref 11.7–15.4)
WBC: 5.5 10*3/uL (ref 3.4–10.8)

## 2021-06-29 ENCOUNTER — Encounter (HOSPITAL_BASED_OUTPATIENT_CLINIC_OR_DEPARTMENT_OTHER): Payer: Self-pay | Admitting: Obstetrics & Gynecology

## 2021-06-29 DIAGNOSIS — N841 Polyp of cervix uteri: Secondary | ICD-10-CM | POA: Insufficient documentation

## 2021-06-29 DIAGNOSIS — Z8759 Personal history of other complications of pregnancy, childbirth and the puerperium: Secondary | ICD-10-CM | POA: Insufficient documentation

## 2021-06-29 DIAGNOSIS — D500 Iron deficiency anemia secondary to blood loss (chronic): Secondary | ICD-10-CM | POA: Insufficient documentation

## 2021-06-29 DIAGNOSIS — N920 Excessive and frequent menstruation with regular cycle: Secondary | ICD-10-CM | POA: Insufficient documentation

## 2021-06-29 DIAGNOSIS — D251 Intramural leiomyoma of uterus: Secondary | ICD-10-CM | POA: Insufficient documentation

## 2021-07-02 ENCOUNTER — Other Ambulatory Visit (HOSPITAL_BASED_OUTPATIENT_CLINIC_OR_DEPARTMENT_OTHER): Payer: Self-pay | Admitting: Obstetrics & Gynecology

## 2021-07-04 ENCOUNTER — Encounter: Payer: Self-pay | Admitting: Obstetrics & Gynecology

## 2021-07-04 ENCOUNTER — Encounter (HOSPITAL_BASED_OUTPATIENT_CLINIC_OR_DEPARTMENT_OTHER): Payer: Self-pay | Admitting: *Deleted

## 2021-07-04 ENCOUNTER — Telehealth: Payer: Self-pay | Admitting: Pharmacy Technician

## 2021-07-04 NOTE — Addendum Note (Signed)
Addended by: Terence Lux on: 07/04/2021 09:47 AM ? ? Modules accepted: Orders ? ?

## 2021-07-04 NOTE — Telephone Encounter (Signed)
Dr. Sabra Heck, ?Fyi note: ?Auth Submission:no auth needed ?Payer: [employer] ?Medication & CPT/J Code(s) submitted: Venofer (Iron Sucrose) J1756 ?Route of submission (phone, fax, portal): phone ?Auth type: Buy/Bill ?Units/visits requested: 3 ?Reference number:  ?Approval from: 07/04/21 to 08/04/21  ? ?Patient will be scheduled as soon as possible ?

## 2021-07-07 ENCOUNTER — Other Ambulatory Visit: Payer: Self-pay | Admitting: Obstetrics & Gynecology

## 2021-07-07 DIAGNOSIS — D259 Leiomyoma of uterus, unspecified: Secondary | ICD-10-CM

## 2021-07-08 ENCOUNTER — Encounter: Payer: Self-pay | Admitting: Obstetrics & Gynecology

## 2021-07-10 ENCOUNTER — Ambulatory Visit: Payer: 59

## 2021-07-10 MED ORDER — SODIUM CHLORIDE 0.9 % IV SOLN
200.0000 mg | Freq: Once | INTRAVENOUS | Status: DC
  Filled 2021-07-10: qty 10

## 2021-07-17 ENCOUNTER — Ambulatory Visit (INDEPENDENT_AMBULATORY_CARE_PROVIDER_SITE_OTHER): Payer: 59

## 2021-07-17 ENCOUNTER — Other Ambulatory Visit (HOSPITAL_BASED_OUTPATIENT_CLINIC_OR_DEPARTMENT_OTHER): Payer: Self-pay | Admitting: Obstetrics & Gynecology

## 2021-07-17 ENCOUNTER — Encounter (HOSPITAL_BASED_OUTPATIENT_CLINIC_OR_DEPARTMENT_OTHER): Payer: Self-pay | Admitting: Obstetrics & Gynecology

## 2021-07-17 VITALS — BP 124/79 | HR 75 | Temp 98.2°F | Resp 16 | Ht 65.0 in | Wt 158.4 lb

## 2021-07-17 DIAGNOSIS — D251 Intramural leiomyoma of uterus: Secondary | ICD-10-CM

## 2021-07-17 DIAGNOSIS — D500 Iron deficiency anemia secondary to blood loss (chronic): Secondary | ICD-10-CM | POA: Diagnosis not present

## 2021-07-17 DIAGNOSIS — N920 Excessive and frequent menstruation with regular cycle: Secondary | ICD-10-CM

## 2021-07-17 DIAGNOSIS — D250 Submucous leiomyoma of uterus: Secondary | ICD-10-CM

## 2021-07-17 MED ORDER — SODIUM CHLORIDE 0.9 % IV SOLN
200.0000 mg | Freq: Once | INTRAVENOUS | Status: AC
  Administered 2021-07-17: 200 mg via INTRAVENOUS
  Filled 2021-07-17: qty 10

## 2021-07-17 NOTE — Progress Notes (Signed)
Diagnosis: Iron Deficiency Anemia ? ?Provider:  Marshell Garfinkel, MD ? ?Procedure: Infusion ? ?IV Type: Peripheral, IV Location: R Hand ? ?Venofer (Iron Sucrose), Dose: 200 mg ? ?Infusion Start Time: [phone] ? ?Infusion Stop Time: 1005 ? ?Post Infusion IV Care: Observation period completed ? ?Discharge: Condition: Good, Destination: Home . AVS provided to patient.  ? ?Performed by:  Paul Dykes, RN  ?  ?

## 2021-07-21 ENCOUNTER — Ambulatory Visit: Payer: 59 | Admitting: Podiatry

## 2021-07-24 ENCOUNTER — Ambulatory Visit (INDEPENDENT_AMBULATORY_CARE_PROVIDER_SITE_OTHER): Payer: 59

## 2021-07-24 ENCOUNTER — Other Ambulatory Visit: Payer: Self-pay | Admitting: Pharmacy Technician

## 2021-07-24 VITALS — BP 121/81 | HR 76 | Temp 98.8°F | Resp 16 | Ht 67.0 in | Wt 159.2 lb

## 2021-07-24 DIAGNOSIS — D500 Iron deficiency anemia secondary to blood loss (chronic): Secondary | ICD-10-CM | POA: Diagnosis not present

## 2021-07-24 DIAGNOSIS — D251 Intramural leiomyoma of uterus: Secondary | ICD-10-CM

## 2021-07-24 DIAGNOSIS — N920 Excessive and frequent menstruation with regular cycle: Secondary | ICD-10-CM

## 2021-07-24 MED ORDER — SODIUM CHLORIDE 0.9 % IV SOLN
200.0000 mg | Freq: Once | INTRAVENOUS | Status: AC
  Administered 2021-07-24: 200 mg via INTRAVENOUS
  Filled 2021-07-24: qty 10

## 2021-07-24 NOTE — Progress Notes (Signed)
Diagnosis: Iron Deficiency Anemia ? ?Provider:  Marshell Garfinkel, MD ? ?Procedure: Infusion ? ?IV Type: Peripheral, IV Location: R Antecubital ? ?Venofer (Iron Sucrose), Dose: 200 mg ? ?Infusion Start Time: [phone] ? ?Infusion Stop Time: 1020 ? ?Post Infusion IV Care: Peripheral IV Discontinued ? ?Discharge: Condition: Good, Destination: Home . AVS provided to patient.  ? ?Performed by:  Koren Shiver, RN  ?  ?

## 2021-07-31 ENCOUNTER — Ambulatory Visit (INDEPENDENT_AMBULATORY_CARE_PROVIDER_SITE_OTHER): Payer: 59

## 2021-07-31 VITALS — BP 118/78 | HR 79 | Temp 98.5°F | Resp 16 | Ht 64.0 in | Wt 157.4 lb

## 2021-07-31 DIAGNOSIS — N920 Excessive and frequent menstruation with regular cycle: Secondary | ICD-10-CM

## 2021-07-31 DIAGNOSIS — D500 Iron deficiency anemia secondary to blood loss (chronic): Secondary | ICD-10-CM

## 2021-07-31 DIAGNOSIS — D251 Intramural leiomyoma of uterus: Secondary | ICD-10-CM | POA: Diagnosis not present

## 2021-07-31 MED ORDER — SODIUM CHLORIDE 0.9 % IV SOLN
200.0000 mg | Freq: Once | INTRAVENOUS | Status: AC
  Administered 2021-07-31: 200 mg via INTRAVENOUS
  Filled 2021-07-31: qty 10

## 2021-07-31 NOTE — Progress Notes (Signed)
Diagnosis: Iron Deficiency Anemia ? ?Provider:  Marshell Garfinkel, MD ? ?Procedure: Infusion ? ?IV Type: Peripheral, IV Location: R Antecubital ? ?Venofer (Iron Sucrose), Dose: 200 mg ? ?Infusion Start Time: 1011 ? ?Infusion Stop Time: 1030 ? ?Post Infusion IV Care: Peripheral IV Discontinued ? ?Discharge: Condition: Good, Destination: Home . AVS provided to patient.  ? ?Performed by:  Paul Dykes, RN  ?  ?

## 2021-08-11 ENCOUNTER — Other Ambulatory Visit: Payer: 59

## 2021-08-13 ENCOUNTER — Other Ambulatory Visit: Payer: Self-pay | Admitting: Pharmacy Technician

## 2021-08-14 ENCOUNTER — Ambulatory Visit
Admission: RE | Admit: 2021-08-14 | Discharge: 2021-08-14 | Disposition: A | Discharge status: Home / Self Care | Payer: 59 | Source: Ambulatory Visit | Attending: Obstetrics & Gynecology | Admitting: Obstetrics & Gynecology

## 2021-08-14 DIAGNOSIS — D251 Intramural leiomyoma of uterus: Secondary | ICD-10-CM | POA: Diagnosis not present

## 2021-08-14 DIAGNOSIS — D250 Submucous leiomyoma of uterus: Secondary | ICD-10-CM

## 2021-08-14 DIAGNOSIS — N852 Hypertrophy of uterus: Secondary | ICD-10-CM | POA: Diagnosis not present

## 2021-08-14 DIAGNOSIS — D252 Subserosal leiomyoma of uterus: Secondary | ICD-10-CM | POA: Diagnosis not present

## 2021-08-14 DIAGNOSIS — N858 Other specified noninflammatory disorders of uterus: Secondary | ICD-10-CM | POA: Diagnosis not present

## 2021-08-14 MED ORDER — GADOBENATE DIMEGLUMINE 529 MG/ML IV SOLN
14.0000 mL | Freq: Once | INTRAVENOUS | Status: AC | PRN
  Administered 2021-08-14: 14 mL via INTRAVENOUS

## 2021-08-19 ENCOUNTER — Encounter (HOSPITAL_BASED_OUTPATIENT_CLINIC_OR_DEPARTMENT_OTHER): Payer: Self-pay | Admitting: *Deleted

## 2021-09-01 ENCOUNTER — Encounter: Payer: Self-pay | Admitting: *Deleted

## 2021-09-01 ENCOUNTER — Ambulatory Visit
Admission: RE | Admit: 2021-09-01 | Discharge: 2021-09-01 | Disposition: A | Discharge status: Home / Self Care | Payer: 59 | Source: Ambulatory Visit | Attending: Obstetrics & Gynecology | Admitting: Obstetrics & Gynecology

## 2021-09-01 DIAGNOSIS — D259 Leiomyoma of uterus, unspecified: Secondary | ICD-10-CM | POA: Diagnosis not present

## 2021-09-01 DIAGNOSIS — N921 Excessive and frequent menstruation with irregular cycle: Secondary | ICD-10-CM | POA: Diagnosis not present

## 2021-09-01 HISTORY — PX: IR RADIOLOGIST EVAL & MGMT: IMG5224

## 2021-09-01 NOTE — Consult Note (Signed)
Chief Complaint: Patient was seen in consultation today for symptomatic uterine fibroids at the request of Megan Salon  Referring Physician(s): Miller,Mary S  History of Present Illness: Alison Lowery is a 46 y.o. [id number] African American female with a prior ectopic pregnancy and two vaginal deliveries with children aged 43 and 24 years old. She has had known uterine fibroids for about 3 years with a history of menorrhagia and documented iron deficiency anemia. Hemoglobin has been as low as 8.0 and IV iron infusions have been administered 3 separate times last month on 07/17/21, 07/24/21 and 07/31/21. She is also on oral iron. Anemia has resulted in chronic fatigue. She thinks the IV iron has resulted in a little improvement.  Menstrual cycles are irregular with 7 days of bleeding and 3-4 days of heavy bleeding and passage of clots. She has pain with her menstrual cycles and increased urinary frequency, especially at night. Multiple large fibroids were visualized by CT on 02/21/21. Alison Lowery can palpate fibroids herself in the midline and left lower pelvis.   Pap smear on 03/21/21 was negative for malignancy. The patient has discussed surgery versus uterine fibroid embolization with Dr. Sabra Heck. An MRI of the pelvis was performed on 08/14/21.  Past Medical History:  Diagnosis Date   Ectopic pregnancy    Rheumatoid arthritis ([place])     Past Surgical History:  Procedure Laterality Date   LAPAROSCOPIC UNILATERAL SALPINGECTOMY Left 2000    Allergies: Patient has no known allergies.  Medications: Prior to Admission medications   Medication Sig Start Date End Date Taking? Authorizing Provider  bacitracin-polymyxin b (POLYSPORIN) ophthalmic ointment  09/12/20   [provider]  hydroxychloroquine (PLAQUENIL) 200 MG tablet Take 1 tablet by mouth twice daily, Monday through Friday only. 09/24/20   Bo Merino, MD  metroNIDAZOLE (METROGEL) 0.75 % vaginal gel Place 1 Applicatorful  vaginally at bedtime. Use for 5 nights. 06/26/21   Megan Salon, MD  moxifloxacin (VIGAMOX) 0.5 % ophthalmic solution Place 1 drop into the left eye 4 (four) times daily. 09/12/20   [provider]  norethindrone (MICRONOR) 0.35 MG tablet Take 1 tablet (0.35 mg total) by mouth daily. 06/26/21   Megan Salon, MD     No family history on file.  Social History   Socioeconomic History   Marital status: Single    Spouse name: Not on file   Number of children: 2   Years of education: Not on file   Highest education level: Not on file  Occupational History   Not on file  Tobacco Use   Smoking status: Every Day    Packs/day: 0.50    Years: 15.00    Pack years: 7.50    Types: Cigarettes   Smokeless tobacco: Never  Vaping Use   Vaping Use: Never used  Substance and Sexual Activity   Alcohol use: Yes    Comment: occ   Drug use: No   Sexual activity: Yes    Partners: Male    Birth control/protection: None  Other Topics Concern   Not on file  Social History Narrative   Not on file   Social Determinants of Health   Financial Resource Strain: Not on file  Food Insecurity: Not on file  Transportation Needs: Not on file  Physical Activity: Not on file  Stress: Not on file  Social Connections: Not on file     Review of Systems: A 12 point ROS discussed and pertinent positives are indicated in the HPI above.  All other systems are negative.  Review of Systems  Constitutional:  Positive for fatigue.  HENT: Negative.    Respiratory: Negative.    Cardiovascular: Negative.   Gastrointestinal:  Positive for abdominal distention and constipation. Negative for abdominal pain, blood in stool, diarrhea, nausea and vomiting.  Genitourinary:  Positive for menstrual problem and pelvic pain.  Musculoskeletal: Negative.   Neurological: Negative.    Vital Signs: BP 137/83 (BP Location: Left Arm)   Pulse 82   SpO2 99%   Physical Exam Vitals reviewed. Exam conducted with a  chaperone present.  Constitutional:      General: She is not in acute distress.    Appearance: Normal appearance. She is normal weight. She is not ill-appearing or toxic-appearing.  HENT:     Head: Normocephalic and atraumatic.  Cardiovascular:     Rate and Rhythm: Normal rate and regular rhythm.     Heart sounds: Normal heart sounds. No murmur heard.   No friction rub. No gallop.  Pulmonary:     Effort: Pulmonary effort is normal. No respiratory distress.     Breath sounds: Normal breath sounds. No stridor. No wheezing, rhonchi or rales.  Abdominal:     General: Bowel sounds are normal.     Palpations: Abdomen is soft.     Tenderness: There is no guarding or rebound.     Hernia: No hernia is present.     Comments: Palpable enlarged uterus and fibroids on transabdominal exam extending up to ulbilicus. Uterus mildly tender in LLQ.  Musculoskeletal:     Cervical back: Neck supple. No tenderness.  Lymphadenopathy:     Cervical: No cervical adenopathy.  Skin:    General: Skin is warm and dry.  Neurological:     General: No focal deficit present.     Mental Status: She is alert and oriented to person, place, and time.     Imaging: MR PELVIS W WO CONTRAST  Result Date: 08/15/2021 CLINICAL DATA:  Symptomatic uterine fibroids.  Treatment planning. EXAM: MRI PELVIS WITHOUT AND WITH CONTRAST TECHNIQUE: Multiplanar multisequence MR imaging of the pelvis was performed both before and after administration of intravenous contrast. CONTRAST:  30m MULTIHANCE GADOBENATE DIMEGLUMINE 529 MG/ML IV SOLN COMPARISON:  None Available. FINDINGS: Lower Urinary Tract: No bladder or urethral abnormality identified. Bowel:  Unremarkable visualized pelvic bowel loops. Vascular/Lymphatic: No pathologically enlarged lymph nodes or other significant abnormality. Reproductive: -- Uterus: Measures 16.8 x 11.7 by 12.7 cm (volume = 1310 cm^3). Innumerable fibroids are seen throughout the uterus which range in size from  less than 1 cm to 8 cm in maximum diameter. These fibroids are submucosal, intramural, and subserosal in location. Largest fibroid is subserosal in location and arises from the posterior lower uterine segment, measuring 8.0 x 6.6 x 7.2 cm. This fibroid has a broad soft tissue pedicle of attachment measuring approximately 5.5 cm in width. -- Intracavitary fibroids:  None. -- Pedunculated fibroids: None. -- Fibroid contrast enhancement: All fibroids show contrast enhancement. The dominant subserosal fibroid in the posterior lower uterine segment shows central internal degeneration, but persistent enhancement of approximately 50% of its volume peripherally. -- Right ovary:  Not visualized, however no adnexal mass identified. -- Left ovary:  Not visualized, however no adnexal mass identified. Other: No abnormal free fluid. Musculoskeletal:  Unremarkable. IMPRESSION: Markedly enlarged uterus with diffuse involvement by fibroids, largest measuring 8 cm. No intracavitary or pedunculated fibroids identified. Electronically Signed   By: JMarlaine HindM.D.   On: 08/15/2021 20:59  Labs:  CBC: Recent Labs    09/24/20 1528 01/02/21 1022 02/19/21 1109 06/26/21 1420  WBC 4.6 6.6 5.8 5.5  HGB 8.6* 8.4* 8.0* 8.9*  HCT 30.7* 30.0* 28.6* 30.1*  PLT 253 396 368 317    COAGS: No results for input(s): INR, APTT in the last 8760 hours.  BMP: Recent Labs    09/24/20 1528 01/02/21 1022 02/19/21 1109  NA 138 138 140  K 3.8 4.6 4.2  CL 107 107 106  CO2 '27 25 25  ' GLUCOSE 78 80 89  BUN '8 12 10  ' CALCIUM 9.3 9.2 9.5  CREATININE 0.59 0.66 0.59  GFRNONAA 110  --  >60  GFRAA 127  --   --     LIVER FUNCTION TESTS: Recent Labs    09/24/20 1528 01/02/21 1022 02/19/21 1109  BILITOT 0.4 0.2 0.3  AST '12 22 15  ' ALT '6 12 8  ' ALKPHOS  --   --  43  PROT 6.9 6.7 7.1  ALBUMIN  --   --  4.3     Assessment and Plan:  I met with Alison Lowery.  We reviewed the MRI of the pelvis on 08/14/2021 which shows significant  uterine enlargement and multiple uterine fibroids numbering approximately 20 in total number.  The largest is a posterior uterine body fibroid measuring up to approximately 8 cm followed by a anterior fundal fibroid measuring approximately 7.1 cm in greatest diameter.  The posterior fibroid shows partial central degeneration.  All of the other fibroids demonstrate fairly uniform enhancement after contrast administration.  Uterine volume on the MRI report was estimated to be 1,310 mL.  By my measurements today, I estimated uterine volume as 1,469 mL.  I reviewed options for treatment with Alison Lowery including hysterectomy and uterine fibroid embolization.  There is certainly an indication for treatment given symptomatic anemia requiring IV iron infusion. The advantage of hysterectomy is more definitive treatment with cessation of menstrual cycles.  The advantage of fibroid embolization is a less invasive procedure that could result in significant diminished uterine volume and decrease in menorrhagia that would help to treat iron deficiency anemia and hopefully eliminate the need for additional IV iron infusions.  I reviewed technical details of fibroid embolization with her.  After discussion, Alison Lowery would like to consider her options and has not come to a final decision regarding treatment.  If she has any additional questions regarding surgery, I encouraged her to speak with Dr. Sabra Heck again.  She will contact our office should she decide to pursue fibroid embolization.  Thank you for this interesting consult.  I greatly enjoyed meeting Alison Lowery and look forward to participating in their care.  A copy of this report was sent to the requesting provider on this date.  Electronically Signed: Azzie Roup 09/01/2021, 12:37 PM    I spent a total of  40 Minutes  in face to face in clinical consultation, greater than 50% of which was counseling/coordinating care for symptomatic uterine  fibroids.

## 2021-09-03 ENCOUNTER — Other Ambulatory Visit: Payer: Self-pay | Admitting: Rheumatology

## 2021-09-03 ENCOUNTER — Other Ambulatory Visit (HOSPITAL_BASED_OUTPATIENT_CLINIC_OR_DEPARTMENT_OTHER): Payer: Self-pay | Admitting: Obstetrics & Gynecology

## 2021-09-03 DIAGNOSIS — M0579 Rheumatoid arthritis with rheumatoid factor of multiple sites without organ or systems involvement: Secondary | ICD-10-CM

## 2021-09-03 DIAGNOSIS — D500 Iron deficiency anemia secondary to blood loss (chronic): Secondary | ICD-10-CM

## 2021-09-03 MED ORDER — HYDROXYCHLOROQUINE SULFATE 200 MG PO TABS: ORAL_TABLET | ORAL | 0 refills | Status: DC

## 2021-09-03 NOTE — Telephone Encounter (Signed)
Next Visit: 09/11/2021  Last Visit: 01/02/2021  Labs: 06/26/2021 Hgb 8.9, Hct 30.1, MCV 78, MCH 23.1, MCHC 29.6, RDW 20.9, CMP 02/19/2021 WNL  Eye exam: 09/16/2020 WNL    Current Dose per office note 01/02/2022: Plaquenil 200 mg 1 tablet by mouth twice daily Monday through Friday.    QV:OHCSPZZCKI arthritis involving multiple sites with positive rheumatoid factor, positive anti-CCP, +14 33 eta   Last Fill: 09/24/2020  Patient to update labs at upcoming appointment on 09/11/2021.   Okay to refill Plaquenil?

## 2021-09-03 NOTE — Telephone Encounter (Signed)
Patient called requesting prescription refill of Plaquenil to be sent to [place] at [address] [facility].

## 2021-09-04 NOTE — Progress Notes (Deleted)
Office Visit Note  Patient: Alison Lowery             Date of Birth: [birth year]           MRN: [id number]             PCP: Camillia Herter, NP Referring: Camillia Herter, NP Visit Date: 09/11/2021 Occupation: '@GUAROCC'$ @  Subjective:    History of Present Illness: Alison Lowery is a 46 y.o. female with history of a positive rheumatoid arthritis.  Patient is currently taking Plaquenil 200 mg 1 tablet by mouth twice daily Monday through Friday.  CBC and CMP updated today to monitor for toxicity. PLQ Eye Exam: 09/16/2020 WNL @ [facility].  Patient given a Plaquenil eye examination form to take with her to her upcoming appointment.  Activities of Daily Living:  Patient reports morning stiffness for *** {minute/hour:19697}.   Patient {ACTIONS;DENIES/REPORTS:21021675::"Denies"} nocturnal pain.  Difficulty dressing/grooming: {ACTIONS;DENIES/REPORTS:21021675::"Denies"} Difficulty climbing stairs: {ACTIONS;DENIES/REPORTS:21021675::"Denies"} Difficulty getting out of chair: {ACTIONS;DENIES/REPORTS:21021675::"Denies"} Difficulty using hands for taps, buttons, cutlery, and/or writing: {ACTIONS;DENIES/REPORTS:21021675::"Denies"}  No Rheumatology ROS completed.   PMFS History:  Patient Active Problem List   Diagnosis Date Noted   Iron deficiency anemia due to chronic blood loss 06/29/2021   Menorrhagia with regular cycle 06/29/2021   Cervical polyp 06/29/2021   History of ectopic pregnancy 06/29/2021   Intramural leiomyoma of uterus 06/29/2021   Rheumatoid arthritis involving multiple sites with positive rheumatoid factor, positive anti-CCP, +14 33 eta 12/03/2016   Elevated rheumatoid factor 11/18/2016   Tobacco abuse 11/18/2016   History of abnormal electrocardiogram 11/18/2016   History of anemia 11/18/2016   History of diarrhea 11/18/2016   Breast mass, right 10/21/2010    Past Medical History:  Diagnosis Date   Ectopic pregnancy    Rheumatoid arthritis ([place])     No  family history on file. Past Surgical History:  Procedure Laterality Date   IR RADIOLOGIST EVAL & MGMT  09/01/2021   LAPAROSCOPIC UNILATERAL SALPINGECTOMY Left 2000   Social History   Social History Narrative   Not on file   There is no immunization history for the selected administration types on file for this patient.   Objective: Vital Signs: There were no vitals taken for this visit.   Physical Exam Vitals and nursing note reviewed.  Constitutional:      Appearance: She is well-developed.  HENT:     Head: Normocephalic and atraumatic.  Eyes:     Conjunctiva/sclera: Conjunctivae normal.  Cardiovascular:     Rate and Rhythm: Normal rate and regular rhythm.     Heart sounds: Normal heart sounds.  Pulmonary:     Effort: Pulmonary effort is normal.     Breath sounds: Normal breath sounds.  Abdominal:     General: Bowel sounds are normal.     Palpations: Abdomen is soft.  Musculoskeletal:     Cervical back: Normal range of motion.  Skin:    General: Skin is warm and dry.     Capillary Refill: Capillary refill takes less than 2 seconds.  Neurological:     Mental Status: She is alert and oriented to person, place, and time.  Psychiatric:        Behavior: Behavior normal.     Musculoskeletal Exam: ***  CDAI Exam: CDAI Score: -- Patient Global: --; Provider Global: -- Swollen: --; Tender: -- Joint Exam 09/11/2021   No joint exam has been documented for this visit   There is currently no information documented on the  homunculus. Go to the Rheumatology activity and complete the homunculus joint exam.  Investigation: No additional findings.  Imaging: MR PELVIS W WO CONTRAST  Result Date: 08/15/2021 CLINICAL DATA:  Symptomatic uterine fibroids.  Treatment planning. EXAM: MRI PELVIS WITHOUT AND WITH CONTRAST TECHNIQUE: Multiplanar multisequence MR imaging of the pelvis was performed both before and after administration of intravenous contrast. CONTRAST:  46m  MULTIHANCE GADOBENATE DIMEGLUMINE 529 MG/ML IV SOLN COMPARISON:  None Available. FINDINGS: Lower Urinary Tract: No bladder or urethral abnormality identified. Bowel:  Unremarkable visualized pelvic bowel loops. Vascular/Lymphatic: No pathologically enlarged lymph nodes or other significant abnormality. Reproductive: -- Uterus: Measures 16.8 x 11.7 by 12.7 cm (volume = 1310 cm^3). Innumerable fibroids are seen throughout the uterus which range in size from less than 1 cm to 8 cm in maximum diameter. These fibroids are submucosal, intramural, and subserosal in location. Largest fibroid is subserosal in location and arises from the posterior lower uterine segment, measuring 8.0 x 6.6 x 7.2 cm. This fibroid has a broad soft tissue pedicle of attachment measuring approximately 5.5 cm in width. -- Intracavitary fibroids:  None. -- Pedunculated fibroids: None. -- Fibroid contrast enhancement: All fibroids show contrast enhancement. The dominant subserosal fibroid in the posterior lower uterine segment shows central internal degeneration, but persistent enhancement of approximately 50% of its volume peripherally. -- Right ovary:  Not visualized, however no adnexal mass identified. -- Left ovary:  Not visualized, however no adnexal mass identified. Other: No abnormal free fluid. Musculoskeletal:  Unremarkable. IMPRESSION: Markedly enlarged uterus with diffuse involvement by fibroids, largest measuring 8 cm. No intracavitary or pedunculated fibroids identified. Electronically Signed   By: JMarlaine HindM.D.   On: 08/15/2021 20:59   IR Radiologist Eval & Mgmt  Result Date: 09/01/2021 Please refer to notes tab for details about interventional procedure. (Op Note)   Recent Labs: Lab Results  Component Value Date   WBC 5.5 06/26/2021   HGB 8.9 (L) 06/26/2021   PLT 317 06/26/2021   NA 140 02/19/2021   K 4.2 02/19/2021   CL 106 02/19/2021   CO2 25 02/19/2021   GLUCOSE 89 02/19/2021   BUN 10 02/19/2021   CREATININE  0.59 02/19/2021   BILITOT 0.3 02/19/2021   ALKPHOS 43 02/19/2021   AST 15 02/19/2021   ALT 8 02/19/2021   PROT 7.1 02/19/2021   ALBUMIN 4.3 02/19/2021   CALCIUM 9.5 02/19/2021   GFRAA 127 09/24/2020   QFTBGOLD NEGATIVE 12/30/2016    Speciality Comments: PLQ Eye Exam: 09/16/2020 WNL @ [facility]  Procedures:  No procedures performed Allergies: Patient has no known allergies.   Assessment / Plan:     Visit Diagnoses: Rheumatoid arthritis involving multiple sites with positive rheumatoid factor (HCC)  High risk medication use  Tobacco abuse  Iron deficiency anemia due to chronic blood loss  History of ectopic pregnancy  Orders: No orders of the defined types were placed in this encounter.  No orders of the defined types were placed in this encounter.    Follow-Up Instructions: No follow-ups on file.   TOfilia Neas PA-C  Note - This record has been created using Dragon software.  Chart creation errors have been sought, but may not always  have been located. Such creation errors do not reflect on  the standard of medical care.

## 2021-09-11 ENCOUNTER — Ambulatory Visit: Payer: 59 | Admitting: Physician Assistant

## 2021-09-11 DIAGNOSIS — Z720 Tobacco use: Secondary | ICD-10-CM

## 2021-09-11 DIAGNOSIS — D500 Iron deficiency anemia secondary to blood loss (chronic): Secondary | ICD-10-CM

## 2021-09-11 DIAGNOSIS — Z79899 Other long term (current) drug therapy: Secondary | ICD-10-CM

## 2021-09-11 DIAGNOSIS — Z8759 Personal history of other complications of pregnancy, childbirth and the puerperium: Secondary | ICD-10-CM

## 2021-09-11 DIAGNOSIS — M0579 Rheumatoid arthritis with rheumatoid factor of multiple sites without organ or systems involvement: Secondary | ICD-10-CM

## 2021-09-18 ENCOUNTER — Other Ambulatory Visit (HOSPITAL_BASED_OUTPATIENT_CLINIC_OR_DEPARTMENT_OTHER): Payer: 59

## 2021-10-02 NOTE — Progress Notes (Unsigned)
Office Visit Note  Patient: Alison Lowery             Date of Birth: July 12, 1975           MRN: [id number]             PCP: Camillia Herter, NP Referring: Camillia Herter, NP Visit Date: 10/16/2021 Occupation: '@GUAROCC' @  Subjective:  Left foot pain and swelling   History of Present Illness: Alison Lowery is a 46 y.o. female with history of seropositive rheumatoid arthritis.  She is taking Plaquenil 200 mg 1 tablet by mouth twice daily Monday through Friday.  Patient reports that she has been trying to remain compliant taking Plaquenil as prescribed.  She is tolerating Plaquenil without any side effects.  She states for the past 2 months has been experiencing increased pain in the left foot and left ankle.  She denies any injury prior to the onset of symptoms.  She has been taking ibuprofen as needed for symptomatic relief.  She experiences intermittent stiffness in her knee joints but denies any joint swelling.  She was also had some increased stiffness in the left fifth digit. She remains under the care of Dr. Sabra Heck for management of fibroids and chronic anemia.  She states she is unable to have a hysterectomy due to how low her hemoglobin has been.  She will be scheduled for an iron infusion next week.    Activities of Daily Living:  Patient reports morning stiffness for several hours.   Patient Reports nocturnal pain.  Difficulty dressing/grooming: Denies Difficulty climbing stairs: Denies Difficulty getting out of chair: Denies Difficulty using hands for taps, buttons, cutlery, and/or writing: Reports  Review of Systems  Constitutional:  Positive for fatigue.  HENT:  Positive for mouth dryness. Negative for mouth sores and nose dryness.   Eyes:  Positive for dryness. Negative for pain and itching.  Respiratory:  Negative for shortness of breath and difficulty breathing.   Cardiovascular:  Positive for palpitations. Negative for chest pain.  Gastrointestinal:  Positive for  constipation. Negative for blood in stool and diarrhea.  Endocrine: Positive for increased urination.  Genitourinary:  Negative for difficulty urinating.  Musculoskeletal:  Positive for joint pain, joint pain, joint swelling, myalgias, morning stiffness, muscle tenderness and myalgias.  Skin:  Positive for rash. Negative for color change.  Allergic/Immunologic: Positive for susceptible to infections.  Neurological:  Positive for numbness and weakness. Negative for dizziness, headaches and memory loss.  Hematological:  Positive for bruising/bleeding tendency.  Psychiatric/Behavioral:  Negative for confusion.     PMFS History:  Patient Active Problem List   Diagnosis Date Noted   Iron deficiency anemia due to chronic blood loss 06/29/2021   Menorrhagia with regular cycle 06/29/2021   Cervical polyp 06/29/2021   History of ectopic pregnancy 06/29/2021   Intramural leiomyoma of uterus 06/29/2021   Rheumatoid arthritis involving multiple sites with positive rheumatoid factor, positive anti-CCP, +14 33 eta 12/03/2016   Elevated rheumatoid factor 11/18/2016   Tobacco abuse 11/18/2016   History of abnormal electrocardiogram 11/18/2016   History of diarrhea 11/18/2016   Breast mass, right 10/21/2010    Past Medical History:  Diagnosis Date   Ectopic pregnancy    Rheumatoid arthritis ([place])     History reviewed. No pertinent family history. Past Surgical History:  Procedure Laterality Date   IR RADIOLOGIST EVAL & MGMT  09/01/2021   LAPAROSCOPIC UNILATERAL SALPINGECTOMY Left 2000   Social History   Social History Narrative   Not  on file   There is no immunization history for the selected administration types on file for this patient.   Objective: Vital Signs: BP 122/77 (BP Location: Left Arm, Patient Position: Sitting, Cuff Size: Normal)   Pulse 85   Ht '5\' 7"'  (1.702 m)   Wt 156 lb 12.8 oz (71.1 kg)   BMI 24.56 kg/m    Physical Exam Vitals and nursing note reviewed.   Constitutional:      Appearance: She is well-developed.  HENT:     Head: Normocephalic and atraumatic.  Eyes:     Conjunctiva/sclera: Conjunctivae normal.  Cardiovascular:     Rate and Rhythm: Normal rate and regular rhythm.     Heart sounds: Normal heart sounds.  Pulmonary:     Effort: Pulmonary effort is normal.     Breath sounds: Normal breath sounds.  Abdominal:     General: Bowel sounds are normal.     Palpations: Abdomen is soft.  Musculoskeletal:     Cervical back: Normal range of motion.  Skin:    General: Skin is warm and dry.     Capillary Refill: Capillary refill takes less than 2 seconds.  Neurological:     Mental Status: She is alert and oriented to person, place, and time.  Psychiatric:        Behavior: Behavior normal.      Musculoskeletal Exam: C-spine has slightly limited ROM with lateral rotation to the left.  Shoulder joints, elbow joints, wrist joints, MCPs, PIPs, and DIPs have good ROM with no synovitis.  Tenderness of the left 5th PIP.  Complete fist formation bilaterally.  Hip joints and knee joints have good range of motion with no discomfort.  Some tenderness to palpation over the right ankle with mild warmth.  Tenderness over the left first, second, and third MTP joints.   CDAI Exam: CDAI Score: 1.6  Patient Global: 3 mm; Provider Global: 3 mm Swollen: 0 ; Tender: 5  Joint Exam 10/16/2021      Right  Left  PIP 5      Tender  Ankle      Tender  MTP 1      Tender  MTP 2      Tender  MTP 3      Tender     Investigation: No additional findings.  Imaging: No results found.  Recent Labs: Lab Results  Component Value Date   WBC 3.9 10/06/2021   HGB 9.3 (L) 10/06/2021   PLT 287 10/06/2021   NA 140 02/19/2021   K 4.2 02/19/2021   CL 106 02/19/2021   CO2 25 02/19/2021   GLUCOSE 89 02/19/2021   BUN 10 02/19/2021   CREATININE 0.59 02/19/2021   BILITOT 0.3 02/19/2021   ALKPHOS 43 02/19/2021   AST 15 02/19/2021   ALT 8 02/19/2021   PROT  7.1 02/19/2021   ALBUMIN 4.3 02/19/2021   CALCIUM 9.5 02/19/2021   GFRAA 127 09/24/2020   QFTBGOLD NEGATIVE 12/30/2016    Speciality Comments: PLQ Eye Exam: 09/16/2020 WNL @ [facility]  Procedures:  No procedures performed Allergies: Patient has no known allergies.   Assessment / Plan:     Visit Diagnoses: Rheumatoid arthritis involving multiple sites with positive rheumatoid factor (HCC) - +RF, +anti-CCP, +14-3-3 eta. Erosion right 5th MTP: She presents today with increased pain and stiffness in the left ankle joint, left first through third MTP joints, and fifth PIP joint of the left hand.  Mild warmth of the left ankle  joint was noted.  She has been taking Plaquenil 200 mg 1 tablet by mouth twice daily Monday through Friday.  She has been tolerating Plaquenil without any side effects and has been trying to take Plaquenil as prescribed.  She has been experiencing increased discomfort in the left foot for the past 2 months. No injury prior to the onset of symptoms.  ESR will be checked today.  A prednisone taper starting 20 mg taper by 5 mg every 2 days with sent to the pharmacy.  She was encouraged to remain on Plaquenil as prescribed.  She was advised to notify us if she develops signs or symptoms of recurrent flares.  She is hesitant to add any medications on to Plaquenil at this time.  She will follow-up in the office in 5 months or sooner if needed.- Plan: Sedimentation rate  High risk medication use - Plaquenil 200 mg 1 tablet by mouth twice daily Monday through Friday.  PLQ Eye Exam: 09/16/2020 WNL @ [facility].  Patient was advised to schedule an updated Plaquenil eye examination.  She is given a Plaquenil eye exam form to take with her to her appointment. CBC and iron panel drawn on 10/06/2021.  Patient is scheduled to receive an iron infusion next week.  CMP updated today.  She will continue to require updated lab work every 5 months to monitor for drug toxicity  while taking Plaquenil.  - Plan: COMPLETE METABOLIC PANEL WITH GFR  Other medical conditions are listed as follows:   Tobacco abuse  Cervical polyp  Intramural leiomyoma of uterus  Iron deficiency anemia due to chronic blood loss  History of ectopic pregnancy  Orders: Orders Placed This Encounter  Procedures   COMPLETE METABOLIC PANEL WITH GFR   Sedimentation rate   Meds ordered this encounter  Medications   predniSONE (DELTASONE) 5 MG tablet    Sig: Take 4 tablets by mouth daily x2 days, 3 tablets daily x2 days, 2 tablets daily x2 days, 1 tablet daily x2 days.    Dispense:  20 tablet    Refill:  0      Follow-Up Instructions: Return in 5 months (on 03/18/2022) for Rheumatoid arthritis.   Ofilia Neas, PA-C  Note - This record has been created using Dragon software.  Chart creation errors have been sought, but may not always  have been located. Such creation errors do not reflect on  the standard of medical care.

## 2021-10-06 ENCOUNTER — Encounter (HOSPITAL_BASED_OUTPATIENT_CLINIC_OR_DEPARTMENT_OTHER): Payer: Self-pay | Admitting: Obstetrics & Gynecology

## 2021-10-06 ENCOUNTER — Ambulatory Visit (INDEPENDENT_AMBULATORY_CARE_PROVIDER_SITE_OTHER): Payer: 59 | Admitting: Obstetrics & Gynecology

## 2021-10-06 VITALS — BP 110/75 | HR 95 | Resp 14

## 2021-10-06 DIAGNOSIS — D500 Iron deficiency anemia secondary to blood loss (chronic): Secondary | ICD-10-CM | POA: Diagnosis not present

## 2021-10-06 DIAGNOSIS — N920 Excessive and frequent menstruation with regular cycle: Secondary | ICD-10-CM | POA: Diagnosis not present

## 2021-10-06 DIAGNOSIS — Z720 Tobacco use: Secondary | ICD-10-CM

## 2021-10-06 DIAGNOSIS — Z8759 Personal history of other complications of pregnancy, childbirth and the puerperium: Secondary | ICD-10-CM | POA: Diagnosis not present

## 2021-10-06 DIAGNOSIS — D251 Intramural leiomyoma of uterus: Secondary | ICD-10-CM | POA: Diagnosis not present

## 2021-10-07 DIAGNOSIS — F4310 Post-traumatic stress disorder, unspecified: Secondary | ICD-10-CM | POA: Diagnosis not present

## 2021-10-07 DIAGNOSIS — R69 Illness, unspecified: Secondary | ICD-10-CM | POA: Diagnosis not present

## 2021-10-07 LAB — IRON,TIBC AND FERRITIN PANEL
Ferritin: 11 ng/mL — ABNORMAL LOW (ref 15–150)
Iron Saturation: 3 % — CL (ref 15–55)
Iron: 10 ug/dL — ABNORMAL LOW (ref 27–159)
Total Iron Binding Capacity: 335 ug/dL (ref 250–450)
UIBC: 325 ug/dL (ref 131–425)

## 2021-10-07 LAB — CBC
Hematocrit: 31.7 % — ABNORMAL LOW (ref 34.0–46.6)
Hemoglobin: 9.3 g/dL — ABNORMAL LOW (ref 11.1–15.9)
MCH: 24.8 pg — ABNORMAL LOW (ref 26.6–33.0)
MCHC: 29.3 g/dL — ABNORMAL LOW (ref 31.5–35.7)
MCV: 85 fL (ref 79–97)
Platelets: 287 10*3/uL (ref 150–450)
RBC: 3.75 x10E6/uL — ABNORMAL LOW (ref 3.77–5.28)
RDW: 17.9 % — ABNORMAL HIGH (ref 11.7–15.4)
WBC: 3.9 10*3/uL (ref 3.4–10.8)

## 2021-10-08 ENCOUNTER — Other Ambulatory Visit (HOSPITAL_BASED_OUTPATIENT_CLINIC_OR_DEPARTMENT_OTHER): Payer: Self-pay | Admitting: Obstetrics & Gynecology

## 2021-10-08 DIAGNOSIS — D250 Submucous leiomyoma of uterus: Secondary | ICD-10-CM

## 2021-10-08 DIAGNOSIS — D500 Iron deficiency anemia secondary to blood loss (chronic): Secondary | ICD-10-CM

## 2021-10-08 DIAGNOSIS — N920 Excessive and frequent menstruation with regular cycle: Secondary | ICD-10-CM

## 2021-10-10 ENCOUNTER — Encounter (HOSPITAL_BASED_OUTPATIENT_CLINIC_OR_DEPARTMENT_OTHER): Payer: Self-pay | Admitting: Obstetrics & Gynecology

## 2021-10-10 NOTE — Progress Notes (Signed)
GYNECOLOGY  VISIT  CC:   follow up after [place] consult  HPI: 46 y.o. G34P2010 Single Black or [place] American female here for discussion of treatment options for her fibroid uterus.  She has decided that she really wants a hysterectomy and not a uterine artery embolization.  Pt just wants definitve treatment.  She and I discussed difficulties of size of uterus and possible need for laparotomy but could consider laparoscopic hysterectomy.  Would be beneficial to try and shrink uterus some as well if possible.  Pt also has iron deficiency anemia and has received iron infusions x 2.  Having repeat CBC and iron levels today.  If improved, will proceed with surgical planning.  If not, will refer to hematology.   Past Medical History:  Diagnosis Date   Ectopic pregnancy    Rheumatoid arthritis ([place])     MEDS:   Current Outpatient Medications on File Prior to Visit  Medication Sig Dispense Refill   bacitracin-polymyxin b (POLYSPORIN) ophthalmic ointment      hydroxychloroquine (PLAQUENIL) 200 MG tablet Take 1 tablet by mouth twice daily, Monday through Friday only. 40 tablet 0   metroNIDAZOLE (METROGEL) 0.75 % vaginal gel Place 1 Applicatorful vaginally at bedtime. Use for 5 nights. 70 g 0   moxifloxacin (VIGAMOX) 0.5 % ophthalmic solution Place 1 drop into the left eye 4 (four) times daily.     norethindrone (MICRONOR) 0.35 MG tablet Take 1 tablet (0.35 mg total) by mouth daily. 84 tablet 3   No current facility-administered medications on file prior to visit.    ALLERGIES: Patient has no known allergies.  SH:  single, non smoker  Review of Systems  Constitutional: Negative.   Genitourinary:        Menorrhagia    PHYSICAL EXAMINATION:    BP 110/75   Pulse 95   Resp 14     General appearance: alert, cooperative and appears stated age Abdomen: soft, non-tender; bowel sounds normal; no masses,  no organomegaly Lymph:  no inguinal LAD noted  Pelvic: External genitalia:  no lesions               Urethra:  normal appearing urethra with no masses, tenderness or lesions              Bartholins and Skenes: normal                 Vagina: normal appearing vagina with normal color and discharge, no lesions                Bimanual Exam:  Uterus:  enlarged, 18 weeks size              Adnexa: no mass, fullness, tenderness  Chaperone, Octaviano Batty, CMA, was present for exam.  Assessment/Plan: 1. Intramural leiomyoma of uterus - will proceed with scheduling surgery - consider pre-treatment with possible depo lupron if needed  2. Iron deficiency anemia due to chronic blood loss - CBC - Iron, TIBC and Ferritin Panel  3. Menorrhagia with regular cycle  4. History of ectopic pregnancy  5. Tobacco use

## 2021-10-16 ENCOUNTER — Encounter: Payer: Self-pay | Admitting: Physician Assistant

## 2021-10-16 ENCOUNTER — Ambulatory Visit (INDEPENDENT_AMBULATORY_CARE_PROVIDER_SITE_OTHER): Payer: 59 | Admitting: Physician Assistant

## 2021-10-16 VITALS — BP 122/77 | HR 85 | Ht 67.0 in | Wt 156.8 lb

## 2021-10-16 DIAGNOSIS — N841 Polyp of cervix uteri: Secondary | ICD-10-CM

## 2021-10-16 DIAGNOSIS — Z8759 Personal history of other complications of pregnancy, childbirth and the puerperium: Secondary | ICD-10-CM

## 2021-10-16 DIAGNOSIS — Z720 Tobacco use: Secondary | ICD-10-CM | POA: Diagnosis not present

## 2021-10-16 DIAGNOSIS — M0579 Rheumatoid arthritis with rheumatoid factor of multiple sites without organ or systems involvement: Secondary | ICD-10-CM | POA: Diagnosis not present

## 2021-10-16 DIAGNOSIS — D500 Iron deficiency anemia secondary to blood loss (chronic): Secondary | ICD-10-CM | POA: Diagnosis not present

## 2021-10-16 DIAGNOSIS — Z79899 Other long term (current) drug therapy: Secondary | ICD-10-CM | POA: Diagnosis not present

## 2021-10-16 DIAGNOSIS — D251 Intramural leiomyoma of uterus: Secondary | ICD-10-CM

## 2021-10-16 MED ORDER — PREDNISONE 5 MG PO TABS: ORAL_TABLET | ORAL | 0 refills | Status: DC

## 2021-10-17 LAB — COMPLETE METABOLIC PANEL WITH GFR
AG Ratio: 1.8 (calc) (ref 1.0–2.5)
ALT: 8 U/L (ref 6–29)
AST: 14 U/L (ref 10–35)
Albumin: 4.3 g/dL (ref 3.6–5.1)
Alkaline phosphatase (APISO): 38 U/L (ref 31–125)
BUN: 12 mg/dL (ref 7–25)
CO2: 24 mmol/L (ref 20–32)
Calcium: 9.5 mg/dL (ref 8.6–10.2)
Chloride: 108 mmol/L (ref 98–110)
Creat: 0.67 mg/dL (ref 0.50–0.99)
Globulin: 2.4 g/dL (calc) (ref 1.9–3.7)
Glucose, Bld: 84 mg/dL (ref 65–99)
Potassium: 4.2 mmol/L (ref 3.5–5.3)
Sodium: 138 mmol/L (ref 135–146)
Total Bilirubin: 0.2 mg/dL (ref 0.2–1.2)
Total Protein: 6.7 g/dL (ref 6.1–8.1)
eGFR: 108 mL/min/{1.73_m2} (ref >=60)

## 2021-10-17 LAB — SEDIMENTATION RATE: Sed Rate: 14 mm/h (ref 0–20)

## 2021-10-17 NOTE — Progress Notes (Signed)
CMP WNL.  ESR WNL

## 2021-10-20 ENCOUNTER — Encounter (HOSPITAL_BASED_OUTPATIENT_CLINIC_OR_DEPARTMENT_OTHER): Payer: Self-pay

## 2021-10-21 NOTE — Telephone Encounter (Signed)
Ok to complete documents

## 2021-10-23 ENCOUNTER — Inpatient Hospital Stay: Payer: 59 | Attending: Nurse Practitioner | Admitting: Nurse Practitioner

## 2021-10-23 ENCOUNTER — Encounter: Payer: Self-pay | Admitting: *Deleted

## 2021-10-23 NOTE — Progress Notes (Signed)
Alison Lowery was "no show" today for lab/OV. Sent scheduling message to reschedule for August.

## 2021-10-28 ENCOUNTER — Other Ambulatory Visit (HOSPITAL_COMMUNITY): Payer: Self-pay | Admitting: Interventional Radiology

## 2021-10-28 DIAGNOSIS — D259 Leiomyoma of uterus, unspecified: Secondary | ICD-10-CM

## 2021-11-13 ENCOUNTER — Other Ambulatory Visit: Payer: Self-pay

## 2021-11-13 ENCOUNTER — Encounter: Payer: Self-pay | Admitting: Nurse Practitioner

## 2021-11-13 ENCOUNTER — Inpatient Hospital Stay: Payer: 59

## 2021-11-13 ENCOUNTER — Inpatient Hospital Stay: Payer: 59 | Attending: Nurse Practitioner | Admitting: Nurse Practitioner

## 2021-11-13 VITALS — BP 119/90 | HR 78 | Temp 98.2°F | Resp 18 | Ht 67.0 in | Wt 157.8 lb

## 2021-11-13 DIAGNOSIS — F17210 Nicotine dependence, cigarettes, uncomplicated: Secondary | ICD-10-CM | POA: Diagnosis not present

## 2021-11-13 DIAGNOSIS — R69 Illness, unspecified: Secondary | ICD-10-CM | POA: Diagnosis not present

## 2021-11-13 DIAGNOSIS — D509 Iron deficiency anemia, unspecified: Secondary | ICD-10-CM

## 2021-11-13 DIAGNOSIS — R1900 Intra-abdominal and pelvic swelling, mass and lump, unspecified site: Secondary | ICD-10-CM

## 2021-11-13 DIAGNOSIS — J439 Emphysema, unspecified: Secondary | ICD-10-CM | POA: Diagnosis not present

## 2021-11-13 DIAGNOSIS — M069 Rheumatoid arthritis, unspecified: Secondary | ICD-10-CM | POA: Insufficient documentation

## 2021-11-13 DIAGNOSIS — Z79899 Other long term (current) drug therapy: Secondary | ICD-10-CM | POA: Insufficient documentation

## 2021-11-13 LAB — CBC WITH DIFFERENTIAL (CANCER CENTER ONLY)
Abs Immature Granulocytes: 0.01 10*3/uL (ref 0.00–0.07)
Basophils Absolute: 0.1 10*3/uL (ref 0.0–0.1)
Basophils Relative: 1 %
Eosinophils Absolute: 0.1 10*3/uL (ref 0.0–0.5)
Eosinophils Relative: 2 %
HCT: 31.6 % — ABNORMAL LOW (ref 36.0–46.0)
Hemoglobin: 9.3 g/dL — ABNORMAL LOW (ref 12.0–15.0)
Immature Granulocytes: 0 %
Lymphocytes Relative: 27 %
Lymphs Abs: 1.2 10*3/uL (ref 0.7–4.0)
MCH: 23.4 pg — ABNORMAL LOW (ref 26.0–34.0)
MCHC: 29.4 g/dL — ABNORMAL LOW (ref 30.0–36.0)
MCV: 79.4 fL — ABNORMAL LOW (ref 80.0–100.0)
Monocytes Absolute: 0.4 10*3/uL (ref 0.1–1.0)
Monocytes Relative: 9 %
Neutro Abs: 2.6 10*3/uL (ref 1.7–7.7)
Neutrophils Relative %: 61 %
Platelet Count: 219 10*3/uL (ref 150–400)
RBC: 3.98 MIL/uL (ref 3.87–5.11)
RDW: 17.6 % — ABNORMAL HIGH (ref 11.5–15.5)
WBC Count: 4.2 10*3/uL (ref 4.0–10.5)
nRBC: 0 % (ref 0.0–0.2)

## 2021-11-13 LAB — FERRITIN: Ferritin: 2 ng/mL — ABNORMAL LOW (ref 11–307)

## 2021-11-13 NOTE — Progress Notes (Signed)
  [facility] OFFICE PROGRESS NOTE   Diagnosis: Iron deficiency anemia  INTERVAL HISTORY:   Alison Lowery returns for follow-up.  She was last seen 02/28/2021.  At that time she was taking oral iron.  She did not return for subsequent follow-up visits.  She received Venofer 200 mg IV 07/17/2021, 07/24/2021 and 07/31/2021.  She is being followed by Dr. Sabra Heck regarding menorrhagia/fibroids.  She reports she is scheduled for uterine fibroid embolization 12/08/2021.  She continues to have a heavy menstrual cycle, typically lasting 9 days.  7 of those days are heavy.  No other bleeding.  She reports tolerating Venofer without signs of allergic reaction.  She is not taking oral iron.  She continues to smoke.  She plans to call and schedule her mammogram.  Objective:  Vital signs in last 24 hours:  Blood pressure (!) 119/90, pulse 78, temperature 98.2 F (36.8 C), temperature source Oral, resp. rate 18, height '5\' 7"'$  (1.702 m), weight 157 lb 12.8 oz (71.6 kg), SpO2 98 %.    Resp: Lungs clear bilaterally. Cardio: Regular rate and rhythm. GI: Firm palpable lower abdominal masses. Vascular: No leg edema. Neuro: Alert and oriented.   Lab Results:  Lab Results  Component Value Date   WBC 4.2 11/13/2021   HGB 9.3 (L) 11/13/2021   HCT 31.6 (L) 11/13/2021   MCV 79.4 (L) 11/13/2021   PLT 219 11/13/2021   NEUTROABS 2.6 11/13/2021    Imaging:  No results found.  Medications: I have reviewed the patient's current medications.  Assessment/Plan: Microcytic anemia 02/19/2021 ferritin 3 02/21/2021 urinalysis unremarkable Ferritin 325 mg twice daily beginning 02/26/2021 Stool cards provided 02/28/2021 (reports colonoscopy approximately 3 years ago) Venofer outside infusion center 07/17/2021, 07/24/2021 and 07/31/2021 Rheumatoid arthritis Palpable masslike fullness low abdomen/pelvis CTs 02/21/2021 with enlarged uterus with multiple bulky fibroids CT chest 02/21/2021-small solid  pulmonary nodule right upper lobe measuring 5 mm Noncontrast chest CT in 12 months CT chest 02/21/2021 enhancing lesion of the right breast measuring 8 mm referred for diagnostic mammogram 02/28/2021 Tobacco use Emphysema on chest CT 02/21/2021  Disposition: [name] has iron deficiency anemia most likely due to menstrual blood loss/fibroids.  She is scheduled for uterine fibroid embolization at the end of this month.  She received Venofer at another infusion center in April.  She has persistent iron deficiency anemia.  Ferritin is 2 today.  We decided to proceed with additional IV iron, Venofer 300 mg weekly x3 beginning next week.  She reports tolerating previous Venofer well.  She understands the risk of an allergic/anaphylactic reaction.  We again discussed the small right upper lobe lung nodule identified on chest CT 02/21/2021 as well as the recommendation for a repeat chest CT at a 35-monthinterval.  She agrees with this.  We also discussed the need for a mammogram based on the right breast lesion identified on chest CT 02/21/2021.  We referred her for a diagnostic mammogram 02/28/2021.  She plans to contact the breast center to schedule this.  She will return for follow-up here in approximately 2 months, chest CT a few days prior and hopefully she will have completed the mammogram.   LNed CardANP/GNP-BC   11/13/2021  11:09 AM

## 2021-11-20 ENCOUNTER — Inpatient Hospital Stay: Payer: 59

## 2021-11-20 ENCOUNTER — Telehealth: Payer: Self-pay | Admitting: *Deleted

## 2021-11-20 NOTE — Telephone Encounter (Signed)
Attempted to reach patient re: "no show" for her venofer infusion. No answer and voice mail not set up yet.

## 2021-11-27 ENCOUNTER — Inpatient Hospital Stay: Payer: 59

## 2021-12-04 ENCOUNTER — Telehealth: Payer: Self-pay | Admitting: *Deleted

## 2021-12-04 ENCOUNTER — Inpatient Hospital Stay: Payer: 59

## 2021-12-04 NOTE — Telephone Encounter (Signed)
Called Alison Lowery to f/u on 3rd "no show" for iron infusion. She reports she is having a procedure on 8/28 in IR (embolization of uterus) and wants to see how she does and may be ready to start IV iron a couple weeks later. She has started back on her ferrous sulfate 325 mg bid and doing ok for now.  She agrees to allow nurse to call her 2 weeks after her procedure to determine her wishes.

## 2021-12-05 ENCOUNTER — Other Ambulatory Visit: Payer: Self-pay | Admitting: Radiology

## 2021-12-05 DIAGNOSIS — D259 Leiomyoma of uterus, unspecified: Secondary | ICD-10-CM

## 2021-12-07 ENCOUNTER — Other Ambulatory Visit: Payer: Self-pay | Admitting: Student

## 2021-12-07 NOTE — H&P (Signed)
Chief Complaint: Patient was seen in consultation today for symptomatic uterine fibroids  at the request of Hale Bogus  Referring Physician(s): Hale Bogus  Supervising Physician: Aletta Edouard  Patient Status: [facility] - Out-pt  History of Present Illness: Alison Lowery is a 47 y.o. female with past medical history of uterine fibroids for approximately 3 years and history of menorrhagia with IDA.  Patient's hemoglobin has been as low as 8 and required IV iron infusions on 3 separate occasions in 1 month.  Patient complains of chronic fatigue and states IV iron has not shown any improvement.  Patient consulted with Dr. Kathlene Cote and reviewed pelvic MRI imaging from 08/14/2021 that showed significant uterine enlargement and multiple uterine fibroids.  Treatment options were discussed with Dr. Kathlene Cote.  Patient presents today for uterine fibroid embolization with moderate sedation.  Past Medical History:  Diagnosis Date   Ectopic pregnancy    Rheumatoid arthritis ([place])     Past Surgical History:  Procedure Laterality Date   IR RADIOLOGIST EVAL & MGMT  09/01/2021   LAPAROSCOPIC UNILATERAL SALPINGECTOMY Left 2000    Allergies: Patient has no known allergies.  Medications: Prior to Admission medications   Medication Sig Start Date End Date Taking? Authorizing Provider  bacitracin-polymyxin b (POLYSPORIN) ophthalmic ointment  09/12/20   [provider]  hydroxychloroquine (PLAQUENIL) 200 MG tablet Take 1 tablet by mouth twice daily, Monday through Friday only. 09/03/21   Bo Merino, MD  metroNIDAZOLE (METROGEL) 0.75 % vaginal gel Place 1 Applicatorful vaginally at bedtime. Use for 5 nights. Patient not taking: Reported on 10/16/2021 06/26/21   Megan Salon, MD  moxifloxacin (VIGAMOX) 0.5 % ophthalmic solution Place 1 drop into the left eye 4 (four) times daily. Patient not taking: Reported on 10/16/2021 09/12/20   [provider]  norethindrone (MICRONOR) 0.35 MG  tablet Take 1 tablet (0.35 mg total) by mouth daily. Patient not taking: Reported on 11/13/2021 06/26/21   Megan Salon, MD  predniSONE (DELTASONE) 5 MG tablet Take 4 tablets by mouth daily x2 days, 3 tablets daily x2 days, 2 tablets daily x2 days, 1 tablet daily x2 days. Patient not taking: Reported on 11/13/2021 10/16/21   Ofilia Neas, PA-C     No family history on file.  Social History   Socioeconomic History   Marital status: Single    Spouse name: Not on file   Number of children: 2   Years of education: Not on file   Highest education level: Not on file  Occupational History   Not on file  Tobacco Use   Smoking status: Every Day    Packs/day: 0.50    Years: 15.00    Total pack years: 7.50    Types: Cigarettes    Passive exposure: Never   Smokeless tobacco: Never  Vaping Use   Vaping Use: Never used  Substance and Sexual Activity   Alcohol use: Yes    Comment: occ   Drug use: No   Sexual activity: Yes    Partners: Male    Birth control/protection: None  Other Topics Concern   Not on file  Social History Narrative   Not on file   Social Determinants of Health   Financial Resource Strain: Low Risk  (08/24/2018)   Overall Financial Resource Strain (CARDIA)    Difficulty of Paying Living Expenses: Not hard at all  Food Insecurity: No Food Insecurity (08/24/2018)   Hunger Vital Sign    Worried About Running Out of Food in the  Last Year: Never true    [place] in the Last Year: Never true  Transportation Needs: No Transportation Needs (08/24/2018)   PRAPARE - Hydrologist (Medical): No    Lack of Transportation (Non-Medical): No  Physical Activity: Inactive (08/24/2018)   Exercise Vital Sign    Days of Exercise per Week: 0 days    Minutes of Exercise per Session: 0 min  Stress: No Stress Concern Present (08/24/2018)   [place]    Feeling of Stress : Not at all   Social Connections: Moderately Isolated (08/24/2018)   Social Connection and Isolation Panel [NHANES]    Frequency of Communication with Friends and Family: Twice a week    Frequency of Social Gatherings with Friends and Family: Twice a week    Attends Religious Services: Never    Marine scientist or Organizations: No    Attends Archivist Meetings: Never    Marital Status: Never married    Review of Systems: A 12 point ROS discussed and pertinent positives are indicated in the HPI above.  All other systems are negative.  Review of Systems  All other systems reviewed and are negative.   Vital Signs: There were no vitals taken for this visit.    Physical Exam Vitals reviewed.  Constitutional:      General: She is not in acute distress.    Appearance: Normal appearance. She is not ill-appearing.  HENT:     Head: Normocephalic and atraumatic.     Mouth/Throat:     Mouth: Mucous membranes are dry.     Pharynx: Oropharynx is clear.  Eyes:     Extraocular Movements: Extraocular movements intact.     Pupils: Pupils are equal, round, and reactive to light.  Cardiovascular:     Rate and Rhythm: Normal rate and regular rhythm.     Pulses: Normal pulses.     Heart sounds: Normal heart sounds.  Pulmonary:     Effort: Pulmonary effort is normal. No respiratory distress.     Breath sounds: Normal breath sounds.  Abdominal:     General: Bowel sounds are normal.     Palpations: Abdomen is soft.  Musculoskeletal:     Right lower leg: No edema.     Left lower leg: No edema.  Skin:    General: Skin is warm and dry.  Neurological:     Mental Status: She is alert and oriented to person, place, and time.  Psychiatric:        Mood and Affect: Mood normal.        Behavior: Behavior normal.        Thought Content: Thought content normal.        Judgment: Judgment normal.     Imaging: No results found.  Labs:  CBC: Recent Labs    06/26/21 1420 10/06/21 1157  11/13/21 1030 12/08/21 0843  WBC 5.5 3.9 4.2 4.5  HGB 8.9* 9.3* 9.3* 10.4*  HCT 30.1* 31.7* 31.6* 35.9*  PLT 317 287 219 237    COAGS: Recent Labs    12/08/21 0843  INR 1.0    BMP: Recent Labs    01/02/21 1022 02/19/21 1109 10/16/21 1045 12/08/21 0843  NA 138 140 138 139  K 4.6 4.2 4.2 3.6  CL 107 106 108 107  CO2 '25 25 24 26  '$ GLUCOSE 80 89 84 88  BUN '12 10 12 '$ 16  CALCIUM 9.2 9.5 9.5 9.3  CREATININE 0.66 0.59 0.67 0.77  GFRNONAA  --  >60  --  >60    LIVER FUNCTION TESTS: Recent Labs    01/02/21 1022 02/19/21 1109 10/16/21 1045  BILITOT 0.2 0.3 0.2  AST '22 15 14  '$ ALT '12 8 8  '$ ALKPHOS  --  43  --   PROT 6.7 7.1 6.7  ALBUMIN  --  4.3  --     TUMOR MARKERS: No results for input(s): "AFPTM", "CEA", "CA199", "CHROMGRNA" in the last 8760 hours.  Assessment and Plan:  46 yo female referred to Dr. Kathlene Cote by Hale Bogus, MD, for symptomatic uterine fibroids and IDA. She presents today for uterine artery embolization with moderate sedation.   Pt resting on stretcher. She is A&O. She is in no distress.  Pt request to go home today versus overnight observation.  Pt was advised to see how she is feeling post procedure.  She is NPO per order.   Risks and benefits of procedure were discussed with the patient including, but not limited to bleeding, infection, vascular injury or contrast induced renal failure.   This interventional procedure involves the use of X-rays and because of the nature of the planned procedure, it is possible that we will have prolonged use of X-ray fluoroscopy.   Potential radiation risks to you include (but are not limited to) the following: - A slightly elevated risk for cancer several years later in life. This risk is typically less than 0.5% percent. This risk is low in comparison to the normal incidence of human cancer, which is 33% for women and 50% for men according to the [employer].  - Radiation induced injury can  include skin redness, resembling a rash, tissue breakdown / ulcers and hair loss (which can be temporary or permanent).    The likelihood of either of these occurring depends on the difficulty of the procedure and whether you are sensitive to radiation due to previous procedures, disease, or genetic conditions.    IF your procedure requires a prolonged use of radiation, you will be notified and given written instructions for further action.  It is your responsibility to monitor the irradiated area for the 2 weeks following the procedure and to notify your physician if you are concerned that you have suffered a radiation induced injury.     All of the patient's questions were answered, patient is agreeable to proceed.   Consent signed and in chart.   Thank you for this interesting consult.  I greatly enjoyed meeting Gem Conkle and look forward to participating in their care.  A copy of this report was sent to the requesting provider on this date.  Electronically Signed: Tyson Alias, NP 12/08/2021, 9:08 AM   I spent a total of 20 minutes in face to face in clinical consultation, greater than 50% of which was counseling/coordinating care for symptomatic uterine fibroids.

## 2021-12-08 ENCOUNTER — Observation Stay (HOSPITAL_COMMUNITY)
Admission: RE | Admit: 2021-12-08 | Discharge: 2021-12-09 | Disposition: A | Discharge status: Home / Self Care | Payer: 59 | Source: Ambulatory Visit | Attending: Interventional Radiology | Admitting: Interventional Radiology

## 2021-12-08 ENCOUNTER — Other Ambulatory Visit (HOSPITAL_COMMUNITY): Payer: Self-pay | Admitting: Interventional Radiology

## 2021-12-08 ENCOUNTER — Encounter (HOSPITAL_COMMUNITY): Payer: Self-pay

## 2021-12-08 ENCOUNTER — Ambulatory Visit (HOSPITAL_COMMUNITY)
Admission: RE | Admit: 2021-12-08 | Discharge: 2021-12-08 | Disposition: A | Discharge status: Home / Self Care | Payer: 59 | Source: Ambulatory Visit | Attending: Interventional Radiology | Admitting: Interventional Radiology

## 2021-12-08 VITALS — Resp 14 | Ht 67.0 in | Wt 155.0 lb

## 2021-12-08 DIAGNOSIS — D259 Leiomyoma of uterus, unspecified: Principal | ICD-10-CM | POA: Diagnosis present

## 2021-12-08 DIAGNOSIS — F17210 Nicotine dependence, cigarettes, uncomplicated: Secondary | ICD-10-CM | POA: Diagnosis not present

## 2021-12-08 DIAGNOSIS — D251 Intramural leiomyoma of uterus: Secondary | ICD-10-CM

## 2021-12-08 DIAGNOSIS — D509 Iron deficiency anemia, unspecified: Secondary | ICD-10-CM | POA: Diagnosis not present

## 2021-12-08 DIAGNOSIS — Z79899 Other long term (current) drug therapy: Secondary | ICD-10-CM | POA: Diagnosis not present

## 2021-12-08 HISTORY — PX: IR EMBO TUMOR ORGAN ISCHEMIA INFARCT INC GUIDE ROADMAPPING: IMG5449

## 2021-12-08 HISTORY — PX: IR US GUIDE VASC ACCESS RIGHT: IMG2390

## 2021-12-08 HISTORY — PX: IR ANGIOGRAM SELECTIVE EACH ADDITIONAL VESSEL: IMG667

## 2021-12-08 HISTORY — PX: IR ANGIOGRAM PELVIS SELECTIVE OR SUPRASELECTIVE: IMG661

## 2021-12-08 LAB — CBC WITH DIFFERENTIAL/PLATELET
Abs Immature Granulocytes: 0.01 10*3/uL (ref 0.00–0.07)
Basophils Absolute: 0.1 10*3/uL (ref 0.0–0.1)
Basophils Relative: 1 %
Eosinophils Absolute: 0.1 10*3/uL (ref 0.0–0.5)
Eosinophils Relative: 2 %
HCT: 35.9 % — ABNORMAL LOW (ref 36.0–46.0)
Hemoglobin: 10.4 g/dL — ABNORMAL LOW (ref 12.0–15.0)
Immature Granulocytes: 0 %
Lymphocytes Relative: 24 %
Lymphs Abs: 1.1 10*3/uL (ref 0.7–4.0)
MCH: 23 pg — ABNORMAL LOW (ref 26.0–34.0)
MCHC: 29 g/dL — ABNORMAL LOW (ref 30.0–36.0)
MCV: 79.2 fL — ABNORMAL LOW (ref 80.0–100.0)
Monocytes Absolute: 0.5 10*3/uL (ref 0.1–1.0)
Monocytes Relative: 10 %
Neutro Abs: 2.8 10*3/uL (ref 1.7–7.7)
Neutrophils Relative %: 63 %
Platelets: 237 10*3/uL (ref 150–400)
RBC: 4.53 MIL/uL (ref 3.87–5.11)
RDW: 19.9 % — ABNORMAL HIGH (ref 11.5–15.5)
WBC: 4.5 10*3/uL (ref 4.0–10.5)
nRBC: 0 % (ref 0.0–0.2)

## 2021-12-08 LAB — BASIC METABOLIC PANEL
Anion gap: 6 (ref 5–15)
BUN: 16 mg/dL (ref 6–20)
CO2: 26 mmol/L (ref 22–32)
Calcium: 9.3 mg/dL (ref 8.9–10.3)
Chloride: 107 mmol/L (ref 98–111)
Creatinine, Ser: 0.77 mg/dL (ref 0.44–1.00)
GFR, Estimated: >60 mL/min (ref >60)
Glucose, Bld: 88 mg/dL (ref 70–99)
Potassium: 3.6 mmol/L (ref 3.5–5.1)
Sodium: 139 mmol/L (ref 135–145)

## 2021-12-08 LAB — HCG, SERUM, QUALITATIVE: Preg, Serum: NEGATIVE

## 2021-12-08 LAB — PROTIME-INR
INR: 1 (ref 0.8–1.2)
Prothrombin Time: 13.4 seconds (ref 11.4–15.2)

## 2021-12-08 MED ORDER — SODIUM CHLORIDE 0.9 % IV SOLN: INTRAVENOUS | Status: DC

## 2021-12-08 MED ORDER — CEFAZOLIN SODIUM-DEXTROSE 2-4 GM/100ML-% IV SOLN
2.0000 g | INTRAVENOUS | Status: AC
  Filled 2021-12-08: qty 100

## 2021-12-08 MED ORDER — IOHEXOL 300 MG/ML  SOLN
50.0000 mL | Freq: Once | INTRAMUSCULAR | Status: AC | PRN
  Administered 2021-12-08: 25 mL via INTRA_ARTERIAL

## 2021-12-08 MED ORDER — KETOROLAC TROMETHAMINE 30 MG/ML IJ SOLN
30.0000 mg | INTRAMUSCULAR | Status: AC
  Filled 2021-12-08: qty 1

## 2021-12-08 MED ORDER — MIDAZOLAM HCL 2 MG/2ML IJ SOLN
INTRAMUSCULAR | Status: AC | PRN
  Administered 2021-12-08: .5 mg via INTRAVENOUS

## 2021-12-08 MED ORDER — SODIUM CHLORIDE 0.9% FLUSH
3.0000 mL | Freq: Two times a day (BID) | INTRAVENOUS | Status: DC
  Administered 2021-12-08: 3 mL via INTRAVENOUS

## 2021-12-08 MED ORDER — FENTANYL CITRATE (PF) 100 MCG/2ML IJ SOLN
INTRAMUSCULAR | Status: AC | PRN
  Administered 2021-12-08: 50 ug via INTRAVENOUS

## 2021-12-08 MED ORDER — MIDAZOLAM HCL 5 MG/5ML IJ SOLN
INTRAMUSCULAR | Status: AC | PRN
  Administered 2021-12-08: .5 mg via INTRAVENOUS

## 2021-12-08 MED ORDER — HYDROMORPHONE 1 MG/ML IV SOLN
INTRAVENOUS | Status: DC
  Administered 2021-12-08: 0.9 mg via INTRAVENOUS
  Filled 2021-12-08 (×11): qty 30

## 2021-12-08 MED ORDER — DOCUSATE SODIUM 100 MG PO CAPS
100.0000 mg | ORAL_CAPSULE | Freq: Two times a day (BID) | ORAL | Status: DC
  Administered 2021-12-08 – 2021-12-09 (×2): 100 mg via ORAL
  Filled 2021-12-08 (×2): qty 1

## 2021-12-08 MED ORDER — IOHEXOL 300 MG/ML  SOLN
100.0000 mL | Freq: Once | INTRAMUSCULAR | Status: AC | PRN
  Administered 2021-12-08: 25 mL via INTRA_ARTERIAL

## 2021-12-08 MED ORDER — LIDOCAINE HCL 1 % IJ SOLN
INTRAMUSCULAR | Status: AC | PRN
  Administered 2021-12-08: 10 mL via INTRADERMAL

## 2021-12-08 MED ORDER — KETOROLAC TROMETHAMINE 30 MG/ML IJ SOLN
INTRAMUSCULAR | Status: AC | PRN
  Administered 2021-12-08: 30 mg via INTRAVENOUS

## 2021-12-08 MED ORDER — DIPHENHYDRAMINE HCL 50 MG/ML IJ SOLN: 12.5000 mg | Freq: Four times a day (QID) | INTRAMUSCULAR | Status: DC | PRN

## 2021-12-08 MED ORDER — MIDAZOLAM HCL 2 MG/2ML IJ SOLN
INTRAMUSCULAR | Status: AC | PRN
  Administered 2021-12-08: 1 mg via INTRAVENOUS

## 2021-12-08 MED ORDER — LIDOCAINE HCL 1 % IJ SOLN
INTRAMUSCULAR | Status: AC
  Filled 2021-12-08: qty 20

## 2021-12-08 MED ORDER — IOHEXOL 300 MG/ML  SOLN
100.0000 mL | Freq: Once | INTRAMUSCULAR | Status: AC | PRN
Start: 2021-12-08 — End: 2021-12-08
  Administered 2021-12-08: 25 mL via INTRA_ARTERIAL

## 2021-12-08 MED ORDER — PROMETHAZINE HCL 25 MG PO TABS: 25.0000 mg | ORAL_TABLET | Freq: Three times a day (TID) | ORAL | Status: DC | PRN

## 2021-12-08 MED ORDER — ONDANSETRON HCL 4 MG/2ML IJ SOLN: 4.0000 mg | Freq: Four times a day (QID) | INTRAMUSCULAR | Status: DC | PRN

## 2021-12-08 MED ORDER — FENTANYL CITRATE (PF) 100 MCG/2ML IJ SOLN
INTRAMUSCULAR | Status: AC | PRN
  Administered 2021-12-08: 25 ug via INTRAVENOUS

## 2021-12-08 MED ORDER — ONDANSETRON HCL 4 MG/2ML IJ SOLN
INTRAMUSCULAR | Status: AC | PRN
  Administered 2021-12-08: 4 mg via INTRAVENOUS

## 2021-12-08 MED ORDER — SODIUM CHLORIDE 0.9 % IV SOLN
12.5000 mg | INTRAVENOUS | Status: DC | PRN
  Administered 2021-12-08: 12.5 mg via INTRAVENOUS
  Filled 2021-12-08: qty 12.5

## 2021-12-08 MED ORDER — PREDNISONE 20 MG PO TABS: 20.0000 mg | ORAL_TABLET | Freq: Every day | ORAL | Status: DC

## 2021-12-08 MED ORDER — DOCUSATE SODIUM 100 MG PO CAPS: 100.0000 mg | ORAL_CAPSULE | Freq: Two times a day (BID) | ORAL | Status: DC

## 2021-12-08 MED ORDER — FENTANYL CITRATE (PF) 100 MCG/2ML IJ SOLN
INTRAMUSCULAR | Status: AC
  Filled 2021-12-08: qty 6

## 2021-12-08 MED ORDER — DIPHENHYDRAMINE HCL 12.5 MG/5ML PO ELIX: 12.5000 mg | ORAL_SOLUTION | Freq: Four times a day (QID) | ORAL | Status: DC | PRN

## 2021-12-08 MED ORDER — SODIUM CHLORIDE 0.9% FLUSH
9.0000 mL | INTRAVENOUS | Status: DC | PRN
Start: 2021-12-08 — End: 2021-12-08

## 2021-12-08 MED ORDER — SODIUM CHLORIDE 0.9% FLUSH: 3.0000 mL | INTRAVENOUS | Status: DC | PRN

## 2021-12-08 MED ORDER — KETOROLAC TROMETHAMINE 30 MG/ML IJ SOLN: 30.0000 mg | Freq: Four times a day (QID) | INTRAMUSCULAR | Status: DC

## 2021-12-08 MED ORDER — NALOXONE HCL 0.4 MG/ML IJ SOLN: 0.4000 mg | INTRAMUSCULAR | Status: DC | PRN

## 2021-12-08 MED ORDER — SODIUM CHLORIDE 0.9 % IV SOLN: 250.0000 mL | INTRAVENOUS | Status: DC | PRN

## 2021-12-08 MED ORDER — PREDNISONE 20 MG PO TABS
20.0000 mg | ORAL_TABLET | Freq: Every day | ORAL | Status: AC
  Administered 2021-12-09: 20 mg via ORAL
  Filled 2021-12-08: qty 1

## 2021-12-08 MED ORDER — MIDAZOLAM HCL 2 MG/2ML IJ SOLN
INTRAMUSCULAR | Status: AC
  Filled 2021-12-08: qty 4

## 2021-12-08 MED ORDER — HYDROMORPHONE 1 MG/ML IV SOLN
INTRAVENOUS | Status: DC
  Administered 2021-12-08: 4.3 mg via INTRAVENOUS

## 2021-12-08 MED ORDER — KETOROLAC TROMETHAMINE 30 MG/ML IJ SOLN
INTRAMUSCULAR | Status: AC
  Filled 2021-12-08: qty 1

## 2021-12-08 MED ORDER — ONDANSETRON HCL 4 MG/2ML IJ SOLN
INTRAMUSCULAR | Status: AC
  Filled 2021-12-08: qty 2

## 2021-12-08 MED ORDER — CEFAZOLIN SODIUM-DEXTROSE 2-4 GM/100ML-% IV SOLN
INTRAVENOUS | Status: AC
  Administered 2021-12-08: 2 g via INTRAVENOUS
  Filled 2021-12-08: qty 100

## 2021-12-08 MED ORDER — MORPHINE SULFATE (PF) 2 MG/ML IV SOLN
1.0000 mg | INTRAVENOUS | Status: DC | PRN
  Administered 2021-12-09 (×2): 2 mg via INTRAVENOUS
  Administered 2021-12-09: 1 mg via INTRAVENOUS
  Filled 2021-12-08 (×3): qty 1

## 2021-12-08 MED ORDER — KETOROLAC TROMETHAMINE 30 MG/ML IJ SOLN
30.0000 mg | Freq: Four times a day (QID) | INTRAMUSCULAR | Status: DC
  Administered 2021-12-08 – 2021-12-09 (×4): 30 mg via INTRAVENOUS
  Filled 2021-12-08 (×4): qty 1

## 2021-12-08 MED ORDER — SODIUM CHLORIDE 0.9% FLUSH: 9.0000 mL | INTRAVENOUS | Status: DC | PRN

## 2021-12-08 MED ORDER — PROMETHAZINE HCL 25 MG RE SUPP: 25.0000 mg | Freq: Three times a day (TID) | RECTAL | Status: DC | PRN

## 2021-12-08 MED ORDER — ONDANSETRON HCL 4 MG/2ML IJ SOLN
4.0000 mg | Freq: Four times a day (QID) | INTRAMUSCULAR | Status: DC | PRN
  Administered 2021-12-08: 4 mg via INTRAVENOUS
  Filled 2021-12-08: qty 2

## 2021-12-08 NOTE — Sedation Documentation (Signed)
Patient is resting comfortably. 

## 2021-12-08 NOTE — Procedures (Signed)
Interventional Radiology Procedure Note  Procedure: Uterine fibroid embolization  Complications: None  Estimated Blood Loss: < 10 mL  Findings: Bilateral internal iliac and uterine arteriography followed by bilateral uterine artery embolization with Embosphere particles. Good embolic endpoints achieved.  Closure: Angioseal  Plan:  Admit for overnight observation and treatment of post embolization symptoms.  Venetia Night. Kathlene Cote, M.D Pager:  [phone]

## 2021-12-08 NOTE — Progress Notes (Signed)
Call to [facility] in bed placement to alert that patient will be coming to an inpatient bed post procedure. Aware, working on bed for post procedure.

## 2021-12-08 NOTE — Sedation Documentation (Signed)
Patient states feeling "hot". Cool damp washcloth applied to forehead.

## 2021-12-08 NOTE — Progress Notes (Addendum)
(  12/08/21 - 13:26) Call to Dr. Bethel Born. Patient has 6/10 pain. Demand dose on dPCA not cutting breakthrough pain. Ok to go ahead with loading dose of 0.'5mg'$  dilaudid and continue through protocol x 3 as ordered. Initial dose delivered to patient at 13:28

## 2021-12-09 ENCOUNTER — Other Ambulatory Visit: Payer: Self-pay

## 2021-12-09 DIAGNOSIS — D259 Leiomyoma of uterus, unspecified: Secondary | ICD-10-CM | POA: Diagnosis not present

## 2021-12-09 HISTORY — PX: UTERINE FIBROID EMBOLIZATION: SHX825

## 2021-12-09 MED ORDER — DOCUSATE SODIUM 100 MG PO CAPS: 100.0000 mg | ORAL_CAPSULE | Freq: Two times a day (BID) | ORAL | 0 refills | Status: DC

## 2021-12-09 MED ORDER — HYDROCODONE-ACETAMINOPHEN 5-325 MG PO TABS: 1.0000 | ORAL_TABLET | Freq: Four times a day (QID) | ORAL | 0 refills | Status: DC | PRN

## 2021-12-09 MED ORDER — HYDROCODONE-ACETAMINOPHEN 5-325 MG PO TABS
1.0000 | ORAL_TABLET | ORAL | Status: DC | PRN
  Filled 2021-12-09: qty 2

## 2021-12-09 MED ORDER — PROMETHAZINE HCL 25 MG PO TABS: 25.0000 mg | ORAL_TABLET | Freq: Three times a day (TID) | ORAL | 0 refills | Status: DC | PRN

## 2021-12-09 NOTE — Progress Notes (Signed)
Provided patient with AVS discharge instructions, and answered all questions. Patient had no further questions upon discharge. Placed bandaid over incision site per order.

## 2021-12-09 NOTE — Progress Notes (Signed)
.   Transition of Care (TOC) Screening Note   Patient Details  Name: Alison Lowery Date of Birth: May 12, 1975   Transition of Care [facility]) CM/SW Contact:    Illene Regulus, LCSW Phone Number: 12/09/2021, 10:48 AM    Transition of Care Department [facility]) has reviewed patient and no TOC needs have been identified at this time. We will continue to monitor patient advancement through interdisciplinary progression rounds. If new patient transition needs arise, please place a TOC consult.

## 2021-12-09 NOTE — Discharge Instructions (Signed)
May resume home medications, please take Advil or ibuprofen 600 mg every 6 hours for the next 5 days then may resume normal dosing.  If pelvic cramping not relieved with Advil may take Vicodin.  Please take pain medication with food.  Avoid strenuous activity for the next 3 to 4 days.  Stay well-hydrated.  Do not drive after taking narcotic medication.

## 2021-12-09 NOTE — Discharge Summary (Signed)
Patient ID: Alison Lowery MRN: [id number] DOB/AGE: 1976-01-11 46 y.o.  Admit date: 12/08/2021 Discharge date: 12/09/2021  Supervising Physician: Aletta Edouard  Patient Status: [facility] - In-pt  Admission Diagnoses: Symptomatic uterine fibroids  Discharge Diagnoses: Symptomatic uterine fibroids, status post bilateral uterine artery embolization on 12/08/2021 Principal Problem:   Uterine leiomyoma  Past Medical History:  Diagnosis Date   Ectopic pregnancy    Rheumatoid arthritis [facility])    Past Surgical History:  Procedure Laterality Date   IR ANGIOGRAM PELVIS SELECTIVE OR SUPRASELECTIVE  12/08/2021   IR ANGIOGRAM PELVIS SELECTIVE OR SUPRASELECTIVE  12/08/2021   IR ANGIOGRAM SELECTIVE EACH ADDITIONAL VESSEL  12/08/2021   IR ANGIOGRAM SELECTIVE EACH ADDITIONAL VESSEL  12/08/2021   IR EMBO TUMOR ORGAN ISCHEMIA INFARCT INC GUIDE ROADMAPPING  12/08/2021   IR RADIOLOGIST EVAL & MGMT  09/01/2021   IR US GUIDE VASC ACCESS RIGHT  12/08/2021   LAPAROSCOPIC UNILATERAL SALPINGECTOMY Left 2000     Discharged Condition: good  Hospital Course: Alison Lowery is a 46 year old female with past medical history of uterine fibroids for approximately 3 years and history of menorrhagia with IDA. Patient's hemoglobin has been as low as 8 and required IV iron infusions on 3 separate occasions in 1 month.  Patient complains of chronic fatigue and states IV iron has not shown any improvement.  Patient consulted with Dr. Kathlene Cote and reviewed pelvic MRI imaging from 08/14/2021 that showed significant uterine enlargement and multiple uterine fibroids.  Treatment options were discussed with Dr. Kathlene Cote and she was deemed an appropriate candidate for bilateral uterine artery embolization.  On 12/08/2021 she underwent successful bilateral uterine artery embolization by Dr. Kathlene Cote at [facility].  The procedure was performed without immediate complications and she was admitted for overnight observation for pain  control.  She was placed on Dilaudid PCA pump, however did not tolerate very well due to persistent nausea and vomiting.  She was administered Toradol for pain and antiemetics as needed.  Post procedure she experienced expected pelvic cramping.  On the day of discharge she was stable.  She had minimal pelvic cramping, no further nausea or vomiting, no fever, no chest pain, cough, worsening abdominal pain, or back pain.  She was to tolerate her diet, ambulate and void without significant difficulty.  She was deemed stable for discharge at this time.  IR team will follow up with patient in 3 to 4 weeks in IR clinic.  She was told to resume her home medications.  Electronic prescriptions were sent into patient's pharmacy for Vicodin, Colace and Phenergan.  She was told to contact our service with any additional questions.  She will follow-up with her GYN Dr. Sabra Heck as scheduled.  Consults: None  Significant Diagnostic Studies:  Results for orders placed or performed during the hospital encounter of 12/08/21  CBC with Differential/Platelet  Result Value Ref Range   WBC 4.5 4.0 - 10.5 K/uL   RBC 4.53 3.87 - 5.11 MIL/uL   Hemoglobin 10.4 (L) 12.0 - 15.0 g/dL   HCT 35.9 (L) 36.0 - 46.0 %   MCV 79.2 (L) 80.0 - 100.0 fL   MCH 23.0 (L) 26.0 - 34.0 pg   MCHC 29.0 (L) 30.0 - 36.0 g/dL   RDW 19.9 (H) 11.5 - 15.5 %   Platelets 237 150 - 400 K/uL   nRBC 0.0 0.0 - 0.2 %   Neutrophils Relative % 63 %   Neutro Abs 2.8 1.7 - 7.7 K/uL   Lymphocytes Relative 24 %  Lymphs Abs 1.1 0.7 - 4.0 K/uL   Monocytes Relative 10 %   Monocytes Absolute 0.5 0.1 - 1.0 K/uL   Eosinophils Relative 2 %   Eosinophils Absolute 0.1 0.0 - 0.5 K/uL   Basophils Relative 1 %   Basophils Absolute 0.1 0.0 - 0.1 K/uL   Immature Granulocytes 0 %   Abs Immature Granulocytes 0.01 0.00 - 0.07 K/uL  Protime-INR  Result Value Ref Range   Prothrombin Time 13.4 11.4 - 15.2 seconds   INR 1.0 0.8 - 1.2  Basic metabolic panel  Result Value  Ref Range   Sodium 139 135 - 145 mmol/L   Potassium 3.6 3.5 - 5.1 mmol/L   Chloride 107 98 - 111 mmol/L   CO2 26 22 - 32 mmol/L   Glucose, Bld 88 70 - 99 mg/dL   BUN 16 6 - 20 mg/dL   Creatinine, Ser 0.77 0.44 - 1.00 mg/dL   Calcium 9.3 8.9 - 10.3 mg/dL   GFR, Estimated >60 >60 mL/min   Anion gap 6 5 - 15  hCG, serum, qualitative  Result Value Ref Range   Preg, Serum NEGATIVE NEGATIVE     Treatments: Successful bilateral uterine artery embolization via IV conscious sedation on 12/08/2021  Discharge Exam: Resp. rate 14, height '5\' 7"'$  (1.702 m), weight 154 lb 15.7 oz (70.3 kg), SpO2 94 %. Awake, alert.  Chest clear to auscultation bilaterally.  Heart with regular rate and rhythm.  Abdomen soft, positive bowel sounds, minimal pelvic tenderness  Puncture site right common femoral artery soft, clean, dry, nontender, no hematoma.  No lower extremity edema  Disposition: Discharge disposition: 01-Home or Self Care       Discharge Instructions     Call MD for:  difficulty breathing, headache or visual disturbances   Complete by: As directed    Call MD for:  extreme fatigue   Complete by: As directed    Call MD for:  hives   Complete by: As directed    Call MD for:  persistant dizziness or light-headedness   Complete by: As directed    Call MD for:  persistant nausea and vomiting   Complete by: As directed    Call MD for:  redness, tenderness, or signs of infection (pain, swelling, redness, odor or green/yellow discharge around incision site)   Complete by: As directed    Call MD for:  severe uncontrolled pain   Complete by: As directed    Call MD for:  temperature >100.4   Complete by: As directed    Change dressing (specify)   Complete by: As directed    Apply Band-Aid over puncture site right groin for the next 2 to 3 days.  May change daily.  May wash site with soap and water.   Diet - low sodium heart healthy   Complete by: As directed    Discharge instructions    Complete by: As directed    May resume home medications, stay well-hydrated, avoid strenuous activity for the next 3 to 4 days, please take Advil or ibuprofen 600 mg every 6 hours for the next 5 days then taper to normal dosing; if pelvic cramping not relieved with Advil may use Vicodin; do not drive after taking Vicodin   Driving Restrictions   Complete by: As directed    No driving for the next 24 hours or after taking narcotic medication   Increase activity slowly   Complete by: As directed    Lifting restrictions  Complete by: As directed    No heavy lifting for the next 3 to 4 days   May shower / Bathe   Complete by: As directed    May walk up steps   Complete by: As directed    Sexual Activity Restrictions   Complete by: As directed    No sexual intercourse for 1 week      Allergies as of 12/09/2021   No Known Allergies      Medication List     STOP taking these medications    bacitracin-polymyxin b ophthalmic ointment Commonly known as: POLYSPORIN   metroNIDAZOLE 0.75 % vaginal gel Commonly known as: METROGEL   norethindrone 0.35 MG tablet Commonly known as: MICRONOR       TAKE these medications    docusate sodium 100 MG capsule Commonly known as: COLACE Take 1 capsule (100 mg total) by mouth 2 (two) times daily.   HYDROcodone-acetaminophen 5-325 MG tablet Commonly known as: NORCO/VICODIN Take 1-2 tablets by mouth every 6 (six) hours as needed for moderate pain.   hydroxychloroquine 200 MG tablet Commonly known as: Plaquenil Take 1 tablet by mouth twice daily, Monday through Friday only. What changed:  how much to take how to take this when to take this additional instructions   ibuprofen 200 MG tablet Commonly known as: ADVIL Take 600 mg by mouth as needed for cramping.   promethazine 25 MG tablet Commonly known as: PHENERGAN Take 1 tablet (25 mg total) by mouth every 8 (eight) hours as needed for nausea.               Discharge  Care Instructions  (From admission, onward)           Start     Ordered   12/09/21 0000  Change dressing (specify)       Comments: Apply Band-Aid over puncture site right groin for the next 2 to 3 days.  May change daily.  May wash site with soap and water.   12/09/21 1214            Follow-up Information     Aletta Edouard, MD Follow up.   Specialties: Interventional Radiology, Radiology Why: Radiology team will call you with follow-up appointment with Dr. Kathlene Cote in 3 to 4 weeks; call [phone] or [phone] with any questions Contact information: [address] [place] [address] [phone]         Megan Salon, MD Follow up.   Specialty: Obstetrics and Gynecology Why: Follow-up with Dr. Sabra Heck as scheduled Contact information: [address] [place] [address] [phone]                  Electronically Signed: D. Rowe Robert, PA-C 12/09/2021, 12:17 PM   I have spent Less Than 30 Minutes discharging Alison Lowery.

## 2021-12-10 ENCOUNTER — Telehealth: Payer: Self-pay

## 2021-12-10 NOTE — Telephone Encounter (Signed)
Transition Care Management Unsuccessful Follow-up Telephone Call  Date of discharge and from where:  12/09/2021, [facility]   Attempts:  1st Attempt  Reason for unsuccessful TCM follow-up call:  Unable to leave message- [phone], voicemail not set up.

## 2021-12-11 ENCOUNTER — Telehealth: Payer: Self-pay

## 2021-12-11 NOTE — Telephone Encounter (Signed)
Transition Care Management Follow-up Telephone Call Date of discharge and from where: 12/09/2021, [facility] How have you been since you were released from the hospital? She stated she has some cramping but overall she is doing okay.  Any questions or concerns? No  Items Reviewed: Did the pt receive and understand the discharge instructions provided? Yes  Medications obtained and verified? Yes - she stated that she has all of her medications.  Other? No  Any new allergies since your discharge? No  Dietary orders reviewed? No Do you have support at home? Yes   Home Care and Equipment/Supplies: Were home health services ordered? no If so, what is the name of the agency? N/a  Has the agency set up a time to come to the patient's home? not applicable Were any new equipment or medical supplies ordered?  No What is the name of the medical supply agency? N/a Were you able to get the supplies/equipment? not applicable Do you have any questions related to the use of the equipment or supplies? No  Functional Questionnaire: (I = Independent and D = Dependent) ADLs: independent   Follow up appointments reviewed:  PCP Hospital f/u appt confirmed?  She said she will call when she is ready to schedule an appointment    Specialist Hospital f/u appt confirmed? Yes  - she said it has been scheduled  Are transportation arrangements needed? No  If their condition worsens, is the pt aware to call PCP or go to the Emergency Dept.? Yes Was the patient provided with contact information for the PCP's office or ED? Yes Was to pt encouraged to call back with questions or concerns? Yes

## 2021-12-29 ENCOUNTER — Encounter: Payer: Self-pay | Admitting: Interventional Radiology

## 2021-12-30 ENCOUNTER — Ambulatory Visit (HOSPITAL_BASED_OUTPATIENT_CLINIC_OR_DEPARTMENT_OTHER): Admit: 2021-12-30 | Payer: 59 | Admitting: Obstetrics & Gynecology

## 2021-12-30 ENCOUNTER — Encounter (HOSPITAL_BASED_OUTPATIENT_CLINIC_OR_DEPARTMENT_OTHER): Payer: Self-pay

## 2021-12-30 SURGERY — HYSTERECTOMY, TOTAL, LAPAROSCOPIC, WITH SALPINGECTOMY
Anesthesia: Choice

## 2022-01-02 ENCOUNTER — Ambulatory Visit
Admission: RE | Admit: 2022-01-02 | Discharge: 2022-01-02 | Disposition: A | Discharge status: Home / Self Care | Payer: 59 | Source: Ambulatory Visit | Attending: Radiology | Admitting: Radiology

## 2022-01-02 DIAGNOSIS — D259 Leiomyoma of uterus, unspecified: Secondary | ICD-10-CM

## 2022-01-02 DIAGNOSIS — Z9889 Other specified postprocedural states: Secondary | ICD-10-CM | POA: Diagnosis not present

## 2022-01-02 HISTORY — PX: IR RADIOLOGIST EVAL & MGMT: IMG5224

## 2022-01-02 NOTE — Progress Notes (Signed)
Chief Complaint: Patient was consulted remotely today (TeleHealth) for follow-up after uterine fibroid embolization.  History of Present Illness: Alison Lowery is a 46 y.o. female status post uterine fibroid embolization on 12/08/2021 to treat uterine fibroids causing menorrhagia, iron deficiency anemia and increased urinary frequency. She tolerated the procedure well and was discharged after overnight observation.  She states that she had approximately 1 week of pelvic cramping after the procedure which has since completely resolved.  She is now back to work.  For the past 3 days she has had burning and itching in the vaginal region with some associated orange/brown vaginal discharge.  She feels that this is likely a yeast infection and has been using an over-the-counter treatment without much improvement.  She denies fever.  She has not had a menstrual cycle since the procedure but has just noticed slight spotting over the last day which she feels may be the beginning of her cycle.  Past Medical History:  Diagnosis Date   Ectopic pregnancy    Rheumatoid arthritis ([place])     Past Surgical History:  Procedure Laterality Date   IR ANGIOGRAM PELVIS SELECTIVE OR SUPRASELECTIVE  12/08/2021   IR ANGIOGRAM PELVIS SELECTIVE OR SUPRASELECTIVE  12/08/2021   IR ANGIOGRAM SELECTIVE EACH ADDITIONAL VESSEL  12/08/2021   IR ANGIOGRAM SELECTIVE EACH ADDITIONAL VESSEL  12/08/2021   IR EMBO TUMOR ORGAN ISCHEMIA INFARCT INC GUIDE ROADMAPPING  12/08/2021   IR RADIOLOGIST EVAL & MGMT  09/01/2021   IR US GUIDE VASC ACCESS RIGHT  12/08/2021   LAPAROSCOPIC UNILATERAL SALPINGECTOMY Left 2000    Allergies: Patient has no known allergies.  Medications: Prior to Admission medications   Medication Sig Start Date End Date Taking? Authorizing Provider  docusate sodium (COLACE) 100 MG capsule Take 1 capsule (100 mg total) by mouth 2 (two) times daily. 12/09/21   Allred, Shirlyn Goltz, PA-C  HYDROcodone-acetaminophen  (NORCO/VICODIN) 5-325 MG tablet Take 1-2 tablets by mouth every 6 (six) hours as needed for moderate pain. 12/09/21   Allred, Shirlyn Goltz, PA-C  hydroxychloroquine (PLAQUENIL) 200 MG tablet Take 1 tablet by mouth twice daily, Monday through Friday only. Patient taking differently: Take 200 mg by mouth See admin instructions. Take 200 mg by mouth Monday through Friday 09/03/21   Bo Merino, MD  ibuprofen (ADVIL) 200 MG tablet Take 600 mg by mouth as needed for cramping.    [provider]  promethazine (PHENERGAN) 25 MG tablet Take 1 tablet (25 mg total) by mouth every 8 (eight) hours as needed for nausea. 12/09/21   Allred, Darrell K, PA-C     No family history on file.  Social History   Socioeconomic History   Marital status: Single    Spouse name: Not on file   Number of children: 2   Years of education: Not on file   Highest education level: Not on file  Occupational History   Not on file  Tobacco Use   Smoking status: Every Day    Packs/day: 0.50    Years: 15.00    Total pack years: 7.50    Types: Cigarettes    Passive exposure: Never   Smokeless tobacco: Never  Vaping Use   Vaping Use: Never used  Substance and Sexual Activity   Alcohol use: Yes    Comment: occ   Drug use: No   Sexual activity: Yes    Partners: Male    Birth control/protection: None  Other Topics Concern   Not on file  Social History  Narrative   Not on file   Social Determinants of Health   Financial Resource Strain: Low Risk  (08/24/2018)   Overall Financial Resource Strain (CARDIA)    Difficulty of Paying Living Expenses: Not hard at all  Food Insecurity: No Food Insecurity (08/24/2018)   Hunger Vital Sign    Worried About Running Out of Food in the Last Year: Never true    Ran Out of Food in the Last Year: Never true  Transportation Needs: No Transportation Needs (08/24/2018)   PRAPARE - Hydrologist (Medical): No    Lack of Transportation (Non-Medical):  No  Physical Activity: Inactive (08/24/2018)   Exercise Vital Sign    Days of Exercise per Week: 0 days    Minutes of Exercise per Session: 0 min  Stress: No Stress Concern Present (08/24/2018)   [place]    Feeling of Stress : Not at all  Social Connections: Moderately Isolated (08/24/2018)   Social Connection and Isolation Panel [NHANES]    Frequency of Communication with Friends and Family: Twice a week    Frequency of Social Gatherings with Friends and Family: Twice a week    Attends Religious Services: Never    Marine scientist or Organizations: No    Attends Archivist Meetings: Never    Marital Status: Never married     Review of Systems  Constitutional: Negative.   Respiratory: Negative.    Cardiovascular: Negative.   Gastrointestinal: Negative.   Genitourinary:  Positive for vaginal discharge.       Vaginal itching and burning.  Musculoskeletal: Negative.   Neurological: Negative.     Review of Systems: A 12 point ROS discussed and pertinent positives are indicated in the HPI above.  All other systems are negative.  Physical Exam No direct physical exam was performed (except for noted visual exam findings with Video Visits).   Vital Signs: There were no vitals taken for this visit.  Imaging: IR EMBO TUMOR ORGAN ISCHEMIA INFARCT INC GUIDE ROADMAPPING  Result Date: 12/08/2021 CLINICAL DATA:  Multiple uterine fibroids with associated menorrhagia, iron deficiency anemia and increased urinary frequency. The patient presents for uterine fibroid embolization. EXAM: 1. ULTRASOUND GUIDANCE FOR VASCULAR ACCESS OF THE RIGHT COMMON FEMORAL ARTERY 2. SELECTIVE PELVIC ARTERIOGRAPHY OF THE LEFT INTERNAL ILIAC ARTERY 3. ADDITIONAL SELECTIVE ARTERIOGRAPHY OF THE LEFT UTERINE ARTERY 4. TRANSCATHETER EMBOLIZATION OF THE LEFT UTERINE ARTERY TO EMBOLIZE UTERINE FIBROIDS 5. SELECTIVE PELVIC ARTERIOGRAPHY OF  THE RIGHT INTERNAL ILIAC ARTERY 6. ADDITIONAL SELECTIVE ARTERIOGRAPHY OF THE RIGHT UTERINE ARTERY 7. TRANSCATHETER EMBOLIZATION OF THE RIGHT UTERINE ARTERY TO EMBOLIZE UTERINE FIBROIDS ANESTHESIA/SEDATION: Moderate (conscious) sedation was employed during this procedure. A total of Versed 4.0 mg and Fentanyl 200 mcg was administered intravenously by radiology nursing. Moderate Sedation Time: 105 minutes. The patient's level of consciousness and vital signs were monitored continuously by radiology nursing throughout the procedure under my direct supervision. MEDICATIONS: 2 g IV Ancef, 30 mg IV Toradol, 4 mg IV Zofran FLUOROSCOPY: 670 mGy CONTRAST:  100 mL Omnipaque 300 PROCEDURE: The procedure, risks, benefits, and alternatives were explained to the patient. Questions regarding the procedure were encouraged and answered. The patient understands and consents to the procedure. A time-out was performed prior to initiating the procedure. The right groin was prepped with chlorhexidine in a sterile fashion, and a sterile drape was applied covering the operative field. A sterile gown and sterile gloves were used for  the procedure. Local anesthesia was provided with 1% Lidocaine. Ultrasound was used to confirm patency of the right common femoral artery. A permanent ultrasound image was recorded. After small skin incision, a 21 gauge needle was advanced into the right common femoral artery under direct ultrasound guidance. Ultrasound image documentation was performed. After establishing guide wire access, a 5-French sheath was placed. A 5 Fr Cobra catheter was advanced over a guidewire into the distal abdominal aorta. The catheter was then used to selectively catheterize the left common iliac artery followed by the left internal iliac artery. Selective arteriography was performed of the left internal iliac artery. A coaxial TriSalus TriNav micro infusion catheter was then introduced through the diagnostic catheter and used to  selectively catheterize the left uterine artery over a guide wire utilizing road mapping technique. Selective arteriography of the left uterine artery was performed through the microcatheter. Left uterine artery embolization was then performed with installation of microsphere particles. Follow-up arteriography was performed after embolization. The microcatheter was removed. The diagnostic catheter was then retracted and used to selectively catheterize the right internal iliac artery. Selective arteriography of the right internal iliac artery was performed. The microcatheter was then reintroduced and used to selectively catheterize the right uterine artery over a guidewire utilizing road mapping technique. Selective arteriography of the right uterine artery was then performed through the microcatheter. Right uterine artery embolization was then performed with installation of microsphere particles. Follow-up arteriography was performed after embolization. For both left and right uterine artery embolization, a TriNav pressure enabled device delivery catheter was used. The pressure enabled drug delivery device with intermittent occlusive properties was utilized to increase tumor to non-target ratio and overcome solid tumor stress and interstitial fluid pressure at the site of tumors (fibroids). The device was also used to reduce reflux of particles to decrease risk of non-target embolization. The catheter pressure generating one way valve is intermittently occlusive (HCPCS code [phone]). Right femoral arteriotomy hemostasis: Angio-Seal COMPLICATIONS: None. FINDINGS: Bilateral selective internal iliac arteriography demonstrates normal branching anatomy and well visualized enlarged uterine artery trunks emanating off of proximal anterior divisions and supplying hypervascular branches to bilateral uterine fibroids. Bilateral selective uterine arteriography shows multiple enlarged hypervascular trunks supplying uterine  fibroids. Left uterine artery embolization was performed utilizing 2 vials of 500-700 micron sized and 2/3 vial of 700-900 micron sized Embosphere particles. Completion arteriography demonstrates adequate occlusion of branches supplying uterine fibroids. Right uterine artery embolization was performed utilizing 2 vials of 500-700 micron sized Embosphere particles. Completion arteriography demonstrates adequate occlusion of branches supplying uterine fibroids. Adequate hemostasis was achieved at the femoral arteriotomy site. IMPRESSION: Successful bilateral uterine artery embolization to treat symptomatic uterine fibroid disease. Adequate occlusion of branch vessels supplying uterine fibroids was achieved with microsphere particle embolization. The patient was admitted for overnight observation for treatment of post embolization symptoms. Electronically Signed   By: Aletta Edouard M.D.   On: 12/08/2021 17:09   IR US Guide Vasc Access Right  Result Date: 12/08/2021 CLINICAL DATA:  Multiple uterine fibroids with associated menorrhagia, iron deficiency anemia and increased urinary frequency. The patient presents for uterine fibroid embolization. EXAM: 1. ULTRASOUND GUIDANCE FOR VASCULAR ACCESS OF THE RIGHT COMMON FEMORAL ARTERY 2. SELECTIVE PELVIC ARTERIOGRAPHY OF THE LEFT INTERNAL ILIAC ARTERY 3. ADDITIONAL SELECTIVE ARTERIOGRAPHY OF THE LEFT UTERINE ARTERY 4. TRANSCATHETER EMBOLIZATION OF THE LEFT UTERINE ARTERY TO EMBOLIZE UTERINE FIBROIDS 5. SELECTIVE PELVIC ARTERIOGRAPHY OF THE RIGHT INTERNAL ILIAC ARTERY 6. ADDITIONAL SELECTIVE ARTERIOGRAPHY OF THE RIGHT UTERINE ARTERY 7. TRANSCATHETER  EMBOLIZATION OF THE RIGHT UTERINE ARTERY TO EMBOLIZE UTERINE FIBROIDS ANESTHESIA/SEDATION: Moderate (conscious) sedation was employed during this procedure. A total of Versed 4.0 mg and Fentanyl 200 mcg was administered intravenously by radiology nursing. Moderate Sedation Time: 105 minutes. The patient's level of consciousness  and vital signs were monitored continuously by radiology nursing throughout the procedure under my direct supervision. MEDICATIONS: 2 g IV Ancef, 30 mg IV Toradol, 4 mg IV Zofran FLUOROSCOPY: 670 mGy CONTRAST:  100 mL Omnipaque 300 PROCEDURE: The procedure, risks, benefits, and alternatives were explained to the patient. Questions regarding the procedure were encouraged and answered. The patient understands and consents to the procedure. A time-out was performed prior to initiating the procedure. The right groin was prepped with chlorhexidine in a sterile fashion, and a sterile drape was applied covering the operative field. A sterile gown and sterile gloves were used for the procedure. Local anesthesia was provided with 1% Lidocaine. Ultrasound was used to confirm patency of the right common femoral artery. A permanent ultrasound image was recorded. After small skin incision, a 21 gauge needle was advanced into the right common femoral artery under direct ultrasound guidance. Ultrasound image documentation was performed. After establishing guide wire access, a 5-French sheath was placed. A 5 Fr Cobra catheter was advanced over a guidewire into the distal abdominal aorta. The catheter was then used to selectively catheterize the left common iliac artery followed by the left internal iliac artery. Selective arteriography was performed of the left internal iliac artery. A coaxial TriSalus TriNav micro infusion catheter was then introduced through the diagnostic catheter and used to selectively catheterize the left uterine artery over a guide wire utilizing road mapping technique. Selective arteriography of the left uterine artery was performed through the microcatheter. Left uterine artery embolization was then performed with installation of microsphere particles. Follow-up arteriography was performed after embolization. The microcatheter was removed. The diagnostic catheter was then retracted and used to selectively  catheterize the right internal iliac artery. Selective arteriography of the right internal iliac artery was performed. The microcatheter was then reintroduced and used to selectively catheterize the right uterine artery over a guidewire utilizing road mapping technique. Selective arteriography of the right uterine artery was then performed through the microcatheter. Right uterine artery embolization was then performed with installation of microsphere particles. Follow-up arteriography was performed after embolization. For both left and right uterine artery embolization, a TriNav pressure enabled device delivery catheter was used. The pressure enabled drug delivery device with intermittent occlusive properties was utilized to increase tumor to non-target ratio and overcome solid tumor stress and interstitial fluid pressure at the site of tumors (fibroids). The device was also used to reduce reflux of particles to decrease risk of non-target embolization. The catheter pressure generating one way valve is intermittently occlusive (HCPCS code [phone]). Right femoral arteriotomy hemostasis: Angio-Seal COMPLICATIONS: None. FINDINGS: Bilateral selective internal iliac arteriography demonstrates normal branching anatomy and well visualized enlarged uterine artery trunks emanating off of proximal anterior divisions and supplying hypervascular branches to bilateral uterine fibroids. Bilateral selective uterine arteriography shows multiple enlarged hypervascular trunks supplying uterine fibroids. Left uterine artery embolization was performed utilizing 2 vials of 500-700 micron sized and 2/3 vial of 700-900 micron sized Embosphere particles. Completion arteriography demonstrates adequate occlusion of branches supplying uterine fibroids. Right uterine artery embolization was performed utilizing 2 vials of 500-700 micron sized Embosphere particles. Completion arteriography demonstrates adequate occlusion of branches supplying  uterine fibroids. Adequate hemostasis was achieved at the femoral arteriotomy site. IMPRESSION: Successful  bilateral uterine artery embolization to treat symptomatic uterine fibroid disease. Adequate occlusion of branch vessels supplying uterine fibroids was achieved with microsphere particle embolization. The patient was admitted for overnight observation for treatment of post embolization symptoms. Electronically Signed   By: Aletta Edouard M.D.   On: 12/08/2021 17:09   IR Angiogram Pelvis Selective Or Supraselective  Result Date: 12/08/2021 CLINICAL DATA:  Multiple uterine fibroids with associated menorrhagia, iron deficiency anemia and increased urinary frequency. The patient presents for uterine fibroid embolization. EXAM: 1. ULTRASOUND GUIDANCE FOR VASCULAR ACCESS OF THE RIGHT COMMON FEMORAL ARTERY 2. SELECTIVE PELVIC ARTERIOGRAPHY OF THE LEFT INTERNAL ILIAC ARTERY 3. ADDITIONAL SELECTIVE ARTERIOGRAPHY OF THE LEFT UTERINE ARTERY 4. TRANSCATHETER EMBOLIZATION OF THE LEFT UTERINE ARTERY TO EMBOLIZE UTERINE FIBROIDS 5. SELECTIVE PELVIC ARTERIOGRAPHY OF THE RIGHT INTERNAL ILIAC ARTERY 6. ADDITIONAL SELECTIVE ARTERIOGRAPHY OF THE RIGHT UTERINE ARTERY 7. TRANSCATHETER EMBOLIZATION OF THE RIGHT UTERINE ARTERY TO EMBOLIZE UTERINE FIBROIDS ANESTHESIA/SEDATION: Moderate (conscious) sedation was employed during this procedure. A total of Versed 4.0 mg and Fentanyl 200 mcg was administered intravenously by radiology nursing. Moderate Sedation Time: 105 minutes. The patient's level of consciousness and vital signs were monitored continuously by radiology nursing throughout the procedure under my direct supervision. MEDICATIONS: 2 g IV Ancef, 30 mg IV Toradol, 4 mg IV Zofran FLUOROSCOPY: 670 mGy CONTRAST:  100 mL Omnipaque 300 PROCEDURE: The procedure, risks, benefits, and alternatives were explained to the patient. Questions regarding the procedure were encouraged and answered. The patient understands and consents to  the procedure. A time-out was performed prior to initiating the procedure. The right groin was prepped with chlorhexidine in a sterile fashion, and a sterile drape was applied covering the operative field. A sterile gown and sterile gloves were used for the procedure. Local anesthesia was provided with 1% Lidocaine. Ultrasound was used to confirm patency of the right common femoral artery. A permanent ultrasound image was recorded. After small skin incision, a 21 gauge needle was advanced into the right common femoral artery under direct ultrasound guidance. Ultrasound image documentation was performed. After establishing guide wire access, a 5-French sheath was placed. A 5 Fr Cobra catheter was advanced over a guidewire into the distal abdominal aorta. The catheter was then used to selectively catheterize the left common iliac artery followed by the left internal iliac artery. Selective arteriography was performed of the left internal iliac artery. A coaxial TriSalus TriNav micro infusion catheter was then introduced through the diagnostic catheter and used to selectively catheterize the left uterine artery over a guide wire utilizing road mapping technique. Selective arteriography of the left uterine artery was performed through the microcatheter. Left uterine artery embolization was then performed with installation of microsphere particles. Follow-up arteriography was performed after embolization. The microcatheter was removed. The diagnostic catheter was then retracted and used to selectively catheterize the right internal iliac artery. Selective arteriography of the right internal iliac artery was performed. The microcatheter was then reintroduced and used to selectively catheterize the right uterine artery over a guidewire utilizing road mapping technique. Selective arteriography of the right uterine artery was then performed through the microcatheter. Right uterine artery embolization was then performed with  installation of microsphere particles. Follow-up arteriography was performed after embolization. For both left and right uterine artery embolization, a TriNav pressure enabled device delivery catheter was used. The pressure enabled drug delivery device with intermittent occlusive properties was utilized to increase tumor to non-target ratio and overcome solid tumor stress and interstitial fluid pressure at the site  of tumors (fibroids). The device was also used to reduce reflux of particles to decrease risk of non-target embolization. The catheter pressure generating one way valve is intermittently occlusive (HCPCS code [phone]). Right femoral arteriotomy hemostasis: Angio-Seal COMPLICATIONS: None. FINDINGS: Bilateral selective internal iliac arteriography demonstrates normal branching anatomy and well visualized enlarged uterine artery trunks emanating off of proximal anterior divisions and supplying hypervascular branches to bilateral uterine fibroids. Bilateral selective uterine arteriography shows multiple enlarged hypervascular trunks supplying uterine fibroids. Left uterine artery embolization was performed utilizing 2 vials of 500-700 micron sized and 2/3 vial of 700-900 micron sized Embosphere particles. Completion arteriography demonstrates adequate occlusion of branches supplying uterine fibroids. Right uterine artery embolization was performed utilizing 2 vials of 500-700 micron sized Embosphere particles. Completion arteriography demonstrates adequate occlusion of branches supplying uterine fibroids. Adequate hemostasis was achieved at the femoral arteriotomy site. IMPRESSION: Successful bilateral uterine artery embolization to treat symptomatic uterine fibroid disease. Adequate occlusion of branch vessels supplying uterine fibroids was achieved with microsphere particle embolization. The patient was admitted for overnight observation for treatment of post embolization symptoms. Electronically Signed   By:  Aletta Edouard M.D.   On: 12/08/2021 17:09   IR Angiogram Selective Each Additional Vessel  Result Date: 12/08/2021 CLINICAL DATA:  Multiple uterine fibroids with associated menorrhagia, iron deficiency anemia and increased urinary frequency. The patient presents for uterine fibroid embolization. EXAM: 1. ULTRASOUND GUIDANCE FOR VASCULAR ACCESS OF THE RIGHT COMMON FEMORAL ARTERY 2. SELECTIVE PELVIC ARTERIOGRAPHY OF THE LEFT INTERNAL ILIAC ARTERY 3. ADDITIONAL SELECTIVE ARTERIOGRAPHY OF THE LEFT UTERINE ARTERY 4. TRANSCATHETER EMBOLIZATION OF THE LEFT UTERINE ARTERY TO EMBOLIZE UTERINE FIBROIDS 5. SELECTIVE PELVIC ARTERIOGRAPHY OF THE RIGHT INTERNAL ILIAC ARTERY 6. ADDITIONAL SELECTIVE ARTERIOGRAPHY OF THE RIGHT UTERINE ARTERY 7. TRANSCATHETER EMBOLIZATION OF THE RIGHT UTERINE ARTERY TO EMBOLIZE UTERINE FIBROIDS ANESTHESIA/SEDATION: Moderate (conscious) sedation was employed during this procedure. A total of Versed 4.0 mg and Fentanyl 200 mcg was administered intravenously by radiology nursing. Moderate Sedation Time: 105 minutes. The patient's level of consciousness and vital signs were monitored continuously by radiology nursing throughout the procedure under my direct supervision. MEDICATIONS: 2 g IV Ancef, 30 mg IV Toradol, 4 mg IV Zofran FLUOROSCOPY: 670 mGy CONTRAST:  100 mL Omnipaque 300 PROCEDURE: The procedure, risks, benefits, and alternatives were explained to the patient. Questions regarding the procedure were encouraged and answered. The patient understands and consents to the procedure. A time-out was performed prior to initiating the procedure. The right groin was prepped with chlorhexidine in a sterile fashion, and a sterile drape was applied covering the operative field. A sterile gown and sterile gloves were used for the procedure. Local anesthesia was provided with 1% Lidocaine. Ultrasound was used to confirm patency of the right common femoral artery. A permanent ultrasound image was  recorded. After small skin incision, a 21 gauge needle was advanced into the right common femoral artery under direct ultrasound guidance. Ultrasound image documentation was performed. After establishing guide wire access, a 5-French sheath was placed. A 5 Fr Cobra catheter was advanced over a guidewire into the distal abdominal aorta. The catheter was then used to selectively catheterize the left common iliac artery followed by the left internal iliac artery. Selective arteriography was performed of the left internal iliac artery. A coaxial TriSalus TriNav micro infusion catheter was then introduced through the diagnostic catheter and used to selectively catheterize the left uterine artery over a guide wire utilizing road mapping technique. Selective arteriography of the left uterine artery was performed  through the microcatheter. Left uterine artery embolization was then performed with installation of microsphere particles. Follow-up arteriography was performed after embolization. The microcatheter was removed. The diagnostic catheter was then retracted and used to selectively catheterize the right internal iliac artery. Selective arteriography of the right internal iliac artery was performed. The microcatheter was then reintroduced and used to selectively catheterize the right uterine artery over a guidewire utilizing road mapping technique. Selective arteriography of the right uterine artery was then performed through the microcatheter. Right uterine artery embolization was then performed with installation of microsphere particles. Follow-up arteriography was performed after embolization. For both left and right uterine artery embolization, a TriNav pressure enabled device delivery catheter was used. The pressure enabled drug delivery device with intermittent occlusive properties was utilized to increase tumor to non-target ratio and overcome solid tumor stress and interstitial fluid pressure at the site of  tumors (fibroids). The device was also used to reduce reflux of particles to decrease risk of non-target embolization. The catheter pressure generating one way valve is intermittently occlusive (HCPCS code [id number]). Right femoral arteriotomy hemostasis: Angio-Seal COMPLICATIONS: None. FINDINGS: Bilateral selective internal iliac arteriography demonstrates normal branching anatomy and well visualized enlarged uterine artery trunks emanating off of proximal anterior divisions and supplying hypervascular branches to bilateral uterine fibroids. Bilateral selective uterine arteriography shows multiple enlarged hypervascular trunks supplying uterine fibroids. Left uterine artery embolization was performed utilizing 2 vials of 500-700 micron sized and 2/3 vial of 700-900 micron sized Embosphere particles. Completion arteriography demonstrates adequate occlusion of branches supplying uterine fibroids. Right uterine artery embolization was performed utilizing 2 vials of 500-700 micron sized Embosphere particles. Completion arteriography demonstrates adequate occlusion of branches supplying uterine fibroids. Adequate hemostasis was achieved at the femoral arteriotomy site. IMPRESSION: Successful bilateral uterine artery embolization to treat symptomatic uterine fibroid disease. Adequate occlusion of branch vessels supplying uterine fibroids was achieved with microsphere particle embolization. The patient was admitted for overnight observation for treatment of post embolization symptoms. Electronically Signed   By: Aletta Edouard M.D.   On: 12/08/2021 17:09   IR Angiogram Selective Each Additional Vessel  Result Date: 12/08/2021 CLINICAL DATA:  Multiple uterine fibroids with associated menorrhagia, iron deficiency anemia and increased urinary frequency. The patient presents for uterine fibroid embolization. EXAM: 1. ULTRASOUND GUIDANCE FOR VASCULAR ACCESS OF THE RIGHT COMMON FEMORAL ARTERY 2. SELECTIVE PELVIC ARTERIOGRAPHY  OF THE LEFT INTERNAL ILIAC ARTERY 3. ADDITIONAL SELECTIVE ARTERIOGRAPHY OF THE LEFT UTERINE ARTERY 4. TRANSCATHETER EMBOLIZATION OF THE LEFT UTERINE ARTERY TO EMBOLIZE UTERINE FIBROIDS 5. SELECTIVE PELVIC ARTERIOGRAPHY OF THE RIGHT INTERNAL ILIAC ARTERY 6. ADDITIONAL SELECTIVE ARTERIOGRAPHY OF THE RIGHT UTERINE ARTERY 7. TRANSCATHETER EMBOLIZATION OF THE RIGHT UTERINE ARTERY TO EMBOLIZE UTERINE FIBROIDS ANESTHESIA/SEDATION: Moderate (conscious) sedation was employed during this procedure. A total of Versed 4.0 mg and Fentanyl 200 mcg was administered intravenously by radiology nursing. Moderate Sedation Time: 105 minutes. The patient's level of consciousness and vital signs were monitored continuously by radiology nursing throughout the procedure under my direct supervision. MEDICATIONS: 2 g IV Ancef, 30 mg IV Toradol, 4 mg IV Zofran FLUOROSCOPY: 670 mGy CONTRAST:  100 mL Omnipaque 300 PROCEDURE: The procedure, risks, benefits, and alternatives were explained to the patient. Questions regarding the procedure were encouraged and answered. The patient understands and consents to the procedure. A time-out was performed prior to initiating the procedure. The right groin was prepped with chlorhexidine in a sterile fashion, and a sterile drape was applied covering the operative field. A sterile gown and sterile gloves  were used for the procedure. Local anesthesia was provided with 1% Lidocaine. Ultrasound was used to confirm patency of the right common femoral artery. A permanent ultrasound image was recorded. After small skin incision, a 21 gauge needle was advanced into the right common femoral artery under direct ultrasound guidance. Ultrasound image documentation was performed. After establishing guide wire access, a 5-French sheath was placed. A 5 Fr Cobra catheter was advanced over a guidewire into the distal abdominal aorta. The catheter was then used to selectively catheterize the left common iliac artery followed  by the left internal iliac artery. Selective arteriography was performed of the left internal iliac artery. A coaxial TriSalus TriNav micro infusion catheter was then introduced through the diagnostic catheter and used to selectively catheterize the left uterine artery over a guide wire utilizing road mapping technique. Selective arteriography of the left uterine artery was performed through the microcatheter. Left uterine artery embolization was then performed with installation of microsphere particles. Follow-up arteriography was performed after embolization. The microcatheter was removed. The diagnostic catheter was then retracted and used to selectively catheterize the right internal iliac artery. Selective arteriography of the right internal iliac artery was performed. The microcatheter was then reintroduced and used to selectively catheterize the right uterine artery over a guidewire utilizing road mapping technique. Selective arteriography of the right uterine artery was then performed through the microcatheter. Right uterine artery embolization was then performed with installation of microsphere particles. Follow-up arteriography was performed after embolization. For both left and right uterine artery embolization, a TriNav pressure enabled device delivery catheter was used. The pressure enabled drug delivery device with intermittent occlusive properties was utilized to increase tumor to non-target ratio and overcome solid tumor stress and interstitial fluid pressure at the site of tumors (fibroids). The device was also used to reduce reflux of particles to decrease risk of non-target embolization. The catheter pressure generating one way valve is intermittently occlusive (HCPCS code [id number]). Right femoral arteriotomy hemostasis: Angio-Seal COMPLICATIONS: None. FINDINGS: Bilateral selective internal iliac arteriography demonstrates normal branching anatomy and well visualized enlarged uterine artery trunks  emanating off of proximal anterior divisions and supplying hypervascular branches to bilateral uterine fibroids. Bilateral selective uterine arteriography shows multiple enlarged hypervascular trunks supplying uterine fibroids. Left uterine artery embolization was performed utilizing 2 vials of 500-700 micron sized and 2/3 vial of 700-900 micron sized Embosphere particles. Completion arteriography demonstrates adequate occlusion of branches supplying uterine fibroids. Right uterine artery embolization was performed utilizing 2 vials of 500-700 micron sized Embosphere particles. Completion arteriography demonstrates adequate occlusion of branches supplying uterine fibroids. Adequate hemostasis was achieved at the femoral arteriotomy site. IMPRESSION: Successful bilateral uterine artery embolization to treat symptomatic uterine fibroid disease. Adequate occlusion of branch vessels supplying uterine fibroids was achieved with microsphere particle embolization. The patient was admitted for overnight observation for treatment of post embolization symptoms. Electronically Signed   By: Aletta Edouard M.D.   On: 12/08/2021 17:09   IR Angiogram Pelvis Selective Or Supraselective  Result Date: 12/08/2021 CLINICAL DATA:  Multiple uterine fibroids with associated menorrhagia, iron deficiency anemia and increased urinary frequency. The patient presents for uterine fibroid embolization. EXAM: 1. ULTRASOUND GUIDANCE FOR VASCULAR ACCESS OF THE RIGHT COMMON FEMORAL ARTERY 2. SELECTIVE PELVIC ARTERIOGRAPHY OF THE LEFT INTERNAL ILIAC ARTERY 3. ADDITIONAL SELECTIVE ARTERIOGRAPHY OF THE LEFT UTERINE ARTERY 4. TRANSCATHETER EMBOLIZATION OF THE LEFT UTERINE ARTERY TO EMBOLIZE UTERINE FIBROIDS 5. SELECTIVE PELVIC ARTERIOGRAPHY OF THE RIGHT INTERNAL ILIAC ARTERY 6. ADDITIONAL SELECTIVE ARTERIOGRAPHY OF THE RIGHT UTERINE  ARTERY 7. TRANSCATHETER EMBOLIZATION OF THE RIGHT UTERINE ARTERY TO EMBOLIZE UTERINE FIBROIDS ANESTHESIA/SEDATION:  Moderate (conscious) sedation was employed during this procedure. A total of Versed 4.0 mg and Fentanyl 200 mcg was administered intravenously by radiology nursing. Moderate Sedation Time: 105 minutes. The patient's level of consciousness and vital signs were monitored continuously by radiology nursing throughout the procedure under my direct supervision. MEDICATIONS: 2 g IV Ancef, 30 mg IV Toradol, 4 mg IV Zofran FLUOROSCOPY: 670 mGy CONTRAST:  100 mL Omnipaque 300 PROCEDURE: The procedure, risks, benefits, and alternatives were explained to the patient. Questions regarding the procedure were encouraged and answered. The patient understands and consents to the procedure. A time-out was performed prior to initiating the procedure. The right groin was prepped with chlorhexidine in a sterile fashion, and a sterile drape was applied covering the operative field. A sterile gown and sterile gloves were used for the procedure. Local anesthesia was provided with 1% Lidocaine. Ultrasound was used to confirm patency of the right common femoral artery. A permanent ultrasound image was recorded. After small skin incision, a 21 gauge needle was advanced into the right common femoral artery under direct ultrasound guidance. Ultrasound image documentation was performed. After establishing guide wire access, a 5-French sheath was placed. A 5 Fr Cobra catheter was advanced over a guidewire into the distal abdominal aorta. The catheter was then used to selectively catheterize the left common iliac artery followed by the left internal iliac artery. Selective arteriography was performed of the left internal iliac artery. A coaxial TriSalus TriNav micro infusion catheter was then introduced through the diagnostic catheter and used to selectively catheterize the left uterine artery over a guide wire utilizing road mapping technique. Selective arteriography of the left uterine artery was performed through the microcatheter. Left uterine  artery embolization was then performed with installation of microsphere particles. Follow-up arteriography was performed after embolization. The microcatheter was removed. The diagnostic catheter was then retracted and used to selectively catheterize the right internal iliac artery. Selective arteriography of the right internal iliac artery was performed. The microcatheter was then reintroduced and used to selectively catheterize the right uterine artery over a guidewire utilizing road mapping technique. Selective arteriography of the right uterine artery was then performed through the microcatheter. Right uterine artery embolization was then performed with installation of microsphere particles. Follow-up arteriography was performed after embolization. For both left and right uterine artery embolization, a TriNav pressure enabled device delivery catheter was used. The pressure enabled drug delivery device with intermittent occlusive properties was utilized to increase tumor to non-target ratio and overcome solid tumor stress and interstitial fluid pressure at the site of tumors (fibroids). The device was also used to reduce reflux of particles to decrease risk of non-target embolization. The catheter pressure generating one way valve is intermittently occlusive (HCPCS code [phone]). Right femoral arteriotomy hemostasis: Angio-Seal COMPLICATIONS: None. FINDINGS: Bilateral selective internal iliac arteriography demonstrates normal branching anatomy and well visualized enlarged uterine artery trunks emanating off of proximal anterior divisions and supplying hypervascular branches to bilateral uterine fibroids. Bilateral selective uterine arteriography shows multiple enlarged hypervascular trunks supplying uterine fibroids. Left uterine artery embolization was performed utilizing 2 vials of 500-700 micron sized and 2/3 vial of 700-900 micron sized Embosphere particles. Completion arteriography demonstrates adequate  occlusion of branches supplying uterine fibroids. Right uterine artery embolization was performed utilizing 2 vials of 500-700 micron sized Embosphere particles. Completion arteriography demonstrates adequate occlusion of branches supplying uterine fibroids. Adequate hemostasis was achieved at the femoral arteriotomy  site. IMPRESSION: Successful bilateral uterine artery embolization to treat symptomatic uterine fibroid disease. Adequate occlusion of branch vessels supplying uterine fibroids was achieved with microsphere particle embolization. The patient was admitted for overnight observation for treatment of post embolization symptoms. Electronically Signed   By: Aletta Edouard M.D.   On: 12/08/2021 17:09    Labs:  CBC: Recent Labs    06/26/21 1420 10/06/21 1157 11/13/21 1030 12/08/21 0843  WBC 5.5 3.9 4.2 4.5  HGB 8.9* 9.3* 9.3* 10.4*  HCT 30.1* 31.7* 31.6* 35.9*  PLT 317 287 219 237    COAGS: Recent Labs    12/08/21 0843  INR 1.0    BMP: Recent Labs    02/19/21 1109 10/16/21 1045 12/08/21 0843  NA 140 138 139  K 4.2 4.2 3.6  CL 106 108 107  CO2 '25 24 26  '$ GLUCOSE 89 84 88  BUN '10 12 16  '$ CALCIUM 9.5 9.5 9.3  CREATININE 0.59 0.67 0.77  GFRNONAA >60  --  >60    LIVER FUNCTION TESTS: Recent Labs    02/19/21 1109 10/16/21 1045  BILITOT 0.3 0.2  AST 15 14  ALT 8 8  ALKPHOS 43  --   PROT 7.1 6.7  ALBUMIN 4.3  --     Assessment and Plan:  Possible yeast infection this week with symptoms of vaginal itching and burning.  Since she is not responding well to over-the-counter medication, I recommended that she contact Dr. Ammie Ferrier office for advice regarding evaluation and treatment.  She currently does not appear to have symptoms of significant endometritis following fibroid embolization.  I recommended a follow-up MRI roughly 9 months after the procedure in late May of next year.  I will plan to meet with Ms. Terral personally to review the MRI images with  her.   Electronically Signed: Azzie Roup 01/02/2022, 1:44 PM    I spent a total of 10 Minutes in remote  clinical consultation, greater than 50% of which was counseling/coordinating care post uterine fibroid embolization..    Visit type: Audio only (telephone). Audio (no video) only due to patient's lack of internet/smartphone capability. Alternative for in-person consultation at [facility], [address] [place], [place], [place]. This visit type was conducted due to national recommendations for restrictions regarding the COVID-19 Pandemic (e.g. social distancing).  This format is felt to be most appropriate for this patient at this time.  All issues noted in this document were discussed and addressed.

## 2022-01-15 ENCOUNTER — Ambulatory Visit (HOSPITAL_BASED_OUTPATIENT_CLINIC_OR_DEPARTMENT_OTHER): Admission: RE | Admit: 2022-01-15 | Payer: 59 | Source: Ambulatory Visit

## 2022-01-29 ENCOUNTER — Inpatient Hospital Stay: Payer: 59 | Attending: Nurse Practitioner

## 2022-01-29 ENCOUNTER — Inpatient Hospital Stay: Payer: 59 | Admitting: Oncology

## 2022-01-29 ENCOUNTER — Telehealth: Payer: Self-pay | Admitting: Oncology

## 2022-01-29 ENCOUNTER — Encounter: Payer: Self-pay | Admitting: *Deleted

## 2022-01-29 NOTE — Progress Notes (Signed)
Patient was "no show" for lab/OV today. Scheduling message sent to reschedule for ~ 1 month if she agrees.

## 2022-01-29 NOTE — Telephone Encounter (Signed)
Attempted to contact patient in regards no show appointments, no answer and voicemail is full.

## 2022-02-26 ENCOUNTER — Ambulatory Visit (HOSPITAL_BASED_OUTPATIENT_CLINIC_OR_DEPARTMENT_OTHER): Payer: 59

## 2022-03-17 ENCOUNTER — Encounter: Payer: Self-pay | Admitting: Nurse Practitioner

## 2022-03-18 ENCOUNTER — Encounter: Payer: Self-pay | Admitting: Nurse Practitioner

## 2022-03-23 ENCOUNTER — Ambulatory Visit (HOSPITAL_BASED_OUTPATIENT_CLINIC_OR_DEPARTMENT_OTHER): Payer: 59 | Attending: Nurse Practitioner

## 2022-04-27 ENCOUNTER — Other Ambulatory Visit: Payer: Self-pay | Admitting: Rheumatology

## 2022-04-27 DIAGNOSIS — M0579 Rheumatoid arthritis with rheumatoid factor of multiple sites without organ or systems involvement: Secondary | ICD-10-CM

## 2022-04-29 ENCOUNTER — Encounter: Payer: Self-pay | Admitting: Nurse Practitioner

## 2022-04-30 ENCOUNTER — Telehealth: Payer: Self-pay | Admitting: Rheumatology

## 2022-04-30 NOTE — Telephone Encounter (Signed)
Pt requested a medication refill for Plaquenil to the pharmacy on file. Pt can be reached at [phone].

## 2022-04-30 NOTE — Progress Notes (Deleted)
Office Visit Note  Patient: Alison Lowery             Date of Birth: 02-Feb-1976           MRN: [id number]             PCP: Camillia Herter, NP Referring: Camillia Herter, NP Visit Date: 05/04/2022 Occupation: @GUAROCC$ @  Subjective:    History of Present Illness: Alison Lowery is a 47 y.o. female with history rheumatoid arthritis.  Patient remains on plaquenil 200 mg 1 tablet by mouth twice daily Monday through Friday.   Overdue to update plaquenil eye exam.  Patient was given a plaquenil eye examination form to take with her to her upcoming appointment.  CBC and CMP updated on 12/08/21.  Orders for CBC and CMP released today.    Activities of Daily Living:  Patient reports morning stiffness for *** {minute/hour:19697}.   Patient {ACTIONS;DENIES/REPORTS:21021675::"Denies"} nocturnal pain.  Difficulty dressing/grooming: {ACTIONS;DENIES/REPORTS:21021675::"Denies"} Difficulty climbing stairs: {ACTIONS;DENIES/REPORTS:21021675::"Denies"} Difficulty getting out of chair: {ACTIONS;DENIES/REPORTS:21021675::"Denies"} Difficulty using hands for taps, buttons, cutlery, and/or writing: {ACTIONS;DENIES/REPORTS:21021675::"Denies"}  No Rheumatology ROS completed.   PMFS History:  Patient Active Problem List   Diagnosis Date Noted   Uterine leiomyoma 12/08/2021   Iron deficiency anemia due to chronic blood loss 06/29/2021   Menorrhagia with regular cycle 06/29/2021   Cervical polyp 06/29/2021   History of ectopic pregnancy 06/29/2021   Intramural leiomyoma of uterus 06/29/2021   Rheumatoid arthritis involving multiple sites with positive rheumatoid factor, positive anti-CCP, +14 33 eta 12/03/2016   Elevated rheumatoid factor 11/18/2016   Tobacco abuse 11/18/2016   History of abnormal electrocardiogram 11/18/2016   History of diarrhea 11/18/2016   Breast mass, right 10/21/2010    Past Medical History:  Diagnosis Date   Ectopic pregnancy    Rheumatoid arthritis ([place])     No family  history on file. Past Surgical History:  Procedure Laterality Date   IR ANGIOGRAM PELVIS SELECTIVE OR SUPRASELECTIVE  12/08/2021   IR ANGIOGRAM PELVIS SELECTIVE OR SUPRASELECTIVE  12/08/2021   IR ANGIOGRAM SELECTIVE EACH ADDITIONAL VESSEL  12/08/2021   IR ANGIOGRAM SELECTIVE EACH ADDITIONAL VESSEL  12/08/2021   IR EMBO TUMOR ORGAN ISCHEMIA INFARCT INC GUIDE ROADMAPPING  12/08/2021   IR RADIOLOGIST EVAL & MGMT  09/01/2021   IR RADIOLOGIST EVAL & MGMT  01/02/2022   IR US GUIDE VASC ACCESS RIGHT  12/08/2021   LAPAROSCOPIC UNILATERAL SALPINGECTOMY Left 2000   Social History   Social History Narrative   Not on file   There is no immunization history for the selected administration types on file for this patient.   Objective: Vital Signs: There were no vitals taken for this visit.   Physical Exam Vitals and nursing note reviewed.  Constitutional:      Appearance: She is well-developed.  HENT:     Head: Normocephalic and atraumatic.  Eyes:     Conjunctiva/sclera: Conjunctivae normal.  Cardiovascular:     Rate and Rhythm: Normal rate and regular rhythm.     Heart sounds: Normal heart sounds.  Pulmonary:     Effort: Pulmonary effort is normal.     Breath sounds: Normal breath sounds.  Abdominal:     General: Bowel sounds are normal.     Palpations: Abdomen is soft.  Musculoskeletal:     Cervical back: Normal range of motion.  Skin:    General: Skin is warm and dry.     Capillary Refill: Capillary refill takes less than 2 seconds.  Neurological:  Mental Status: She is alert and oriented to person, place, and time.  Psychiatric:        Behavior: Behavior normal.      Musculoskeletal Exam: ***  CDAI Exam: CDAI Score: -- Patient Global: --; Provider Global: -- Swollen: --; Tender: -- Joint Exam 05/04/2022   No joint exam has been documented for this visit   There is currently no information documented on the homunculus. Go to the Rheumatology activity and complete the  homunculus joint exam.  Investigation: No additional findings.  Imaging: No results found.  Recent Labs: Lab Results  Component Value Date   WBC 4.5 12/08/2021   HGB 10.4 (L) 12/08/2021   PLT 237 12/08/2021   NA 139 12/08/2021   K 3.6 12/08/2021   CL 107 12/08/2021   CO2 26 12/08/2021   GLUCOSE 88 12/08/2021   BUN 16 12/08/2021   CREATININE 0.77 12/08/2021   BILITOT 0.2 10/16/2021   ALKPHOS 43 02/19/2021   AST 14 10/16/2021   ALT 8 10/16/2021   PROT 6.7 10/16/2021   ALBUMIN 4.3 02/19/2021   CALCIUM 9.3 12/08/2021   GFRAA 127 09/24/2020   QFTBGOLD NEGATIVE 12/30/2016    Speciality Comments: PLQ Eye Exam: 09/16/2020 WNL @ [facility]  Procedures:  No procedures performed Allergies: Patient has no known allergies.   Assessment / Plan:     Visit Diagnoses: Rheumatoid arthritis involving multiple sites with positive rheumatoid factor (HCC)  High risk medication use  Iron deficiency anemia due to chronic blood loss  History of ectopic pregnancy  Intramural leiomyoma of uterus  Tobacco abuse  Cervical polyp  Orders: No orders of the defined types were placed in this encounter.  No orders of the defined types were placed in this encounter.   Face-to-face time spent with patient was *** minutes. Greater than 50% of time was spent in counseling and coordination of care.  Follow-Up Instructions: No follow-ups on file.   Ofilia Neas, PA-C  Note - This record has been created using Dragon software.  Chart creation errors have been sought, but may not always  have been located. Such creation errors do not reflect on  the standard of medical care.,

## 2022-04-30 NOTE — Telephone Encounter (Signed)
Spoke with patient and advised we are unable to refill her medication as she is overdue for labs, an office visit and her PLQ eye exam. Patient has been scheduled for 05/04/2022 at 8:20 am.

## 2022-05-01 ENCOUNTER — Other Ambulatory Visit (HOSPITAL_COMMUNITY)
Admission: RE | Admit: 2022-05-01 | Discharge: 2022-05-01 | Disposition: A | Discharge status: Home / Self Care | Payer: No Typology Code available for payment source | Source: Ambulatory Visit | Attending: Obstetrics & Gynecology | Admitting: Obstetrics & Gynecology

## 2022-05-01 ENCOUNTER — Ambulatory Visit (HOSPITAL_BASED_OUTPATIENT_CLINIC_OR_DEPARTMENT_OTHER): Payer: No Typology Code available for payment source

## 2022-05-01 DIAGNOSIS — N898 Other specified noninflammatory disorders of vagina: Secondary | ICD-10-CM | POA: Insufficient documentation

## 2022-05-01 DIAGNOSIS — D500 Iron deficiency anemia secondary to blood loss (chronic): Secondary | ICD-10-CM

## 2022-05-01 NOTE — Progress Notes (Signed)
Patient came in to give an aptima self-swab. She is experiencing some vaginal bleeding, vaginal discharge and some odor. Sample sent to be evaluated. tbw

## 2022-05-04 ENCOUNTER — Ambulatory Visit: Payer: No Typology Code available for payment source | Attending: Physician Assistant | Admitting: Physician Assistant

## 2022-05-04 DIAGNOSIS — Z79899 Other long term (current) drug therapy: Secondary | ICD-10-CM

## 2022-05-04 DIAGNOSIS — Z8759 Personal history of other complications of pregnancy, childbirth and the puerperium: Secondary | ICD-10-CM

## 2022-05-04 DIAGNOSIS — Z720 Tobacco use: Secondary | ICD-10-CM

## 2022-05-04 DIAGNOSIS — N841 Polyp of cervix uteri: Secondary | ICD-10-CM

## 2022-05-04 DIAGNOSIS — M0579 Rheumatoid arthritis with rheumatoid factor of multiple sites without organ or systems involvement: Secondary | ICD-10-CM

## 2022-05-04 DIAGNOSIS — D500 Iron deficiency anemia secondary to blood loss (chronic): Secondary | ICD-10-CM

## 2022-05-04 DIAGNOSIS — D251 Intramural leiomyoma of uterus: Secondary | ICD-10-CM

## 2022-05-04 LAB — CERVICOVAGINAL ANCILLARY ONLY
Bacterial Vaginitis (gardnerella): POSITIVE — AB
Candida Glabrata: NEGATIVE
Candida Vaginitis: NEGATIVE
Chlamydia: NEGATIVE
Comment: NEGATIVE
Comment: NEGATIVE
Comment: NEGATIVE
Comment: NEGATIVE
Comment: NEGATIVE
Comment: NORMAL
Neisseria Gonorrhea: NEGATIVE
Trichomonas: NEGATIVE

## 2022-05-04 MED ORDER — METRONIDAZOLE 0.75 % VA GEL: 1.0000 | Freq: Every day | VAGINAL | 0 refills | Status: DC

## 2022-05-04 NOTE — Addendum Note (Signed)
Addended by: Megan Salon on: 05/04/2022 05:49 PM   Modules accepted: Orders

## 2022-06-24 NOTE — Progress Notes (Unsigned)
Office Visit Note  Patient: Alison Lowery             Date of Birth: 1976/03/15           MRN: [id number]             PCP: Camillia Herter, NP Referring: Camillia Herter, NP Visit Date: 07/08/2022 Occupation: @GUAROCC @  Subjective:  Medication monitoring   History of Present Illness: Alison Lowery is a 47 y.o. female with history of seropositive rheumatoid arthritis.  Patient reports that she has been out of her prescription for Plaquenil since around November 2023.  She states that she typically does not experience any joint pain or joint swelling.  She states that she recently started to experience increased discomfort and swelling in the left foot.  She states that she went to the pharmacy to see if her prescription for Plaquenil was ready but instead they had a prescription for prednisone on file.  She started taking prednisone 20 mg tapering by 5 mg every 2 days and is currently on 15 mg.  Her symptoms have resolved while taking prednisone.  She requested a refill of Plaquenil be sent to the pharmacy today.  She has an upcoming appointment at [facility] for an updated Plaquenil eye examination in April 2024.    Activities of Daily Living:  Patient reports morning stiffness for 20 minutes.   Patient Denies nocturnal pain.  Difficulty dressing/grooming: Denies Difficulty climbing stairs: Denies Difficulty getting out of chair: Denies Difficulty using hands for taps, buttons, cutlery, and/or writing: Denies  Review of Systems  Constitutional:  Negative for fatigue.  HENT:  Negative for mouth sores and mouth dryness.   Eyes:  Negative for dryness.  Respiratory:  Negative for shortness of breath.   Cardiovascular:  Negative for chest pain and palpitations.  Gastrointestinal:  Negative for blood in stool, constipation and diarrhea.  Endocrine: Positive for increased urination.  Genitourinary:  Negative for involuntary urination.  Musculoskeletal:  Positive for morning stiffness  and muscle tenderness. Negative for joint pain, gait problem, joint pain, joint swelling, myalgias, muscle weakness and myalgias.  Skin:  Positive for color change. Negative for rash, hair loss and sensitivity to sunlight.  Allergic/Immunologic: Positive for susceptible to infections.  Neurological:  Negative for dizziness and headaches.  Hematological:  Negative for swollen glands.  Psychiatric/Behavioral:  Negative for depressed mood and sleep disturbance. The patient is not nervous/anxious.     PMFS History:  Patient Active Problem List   Diagnosis Date Noted   Uterine leiomyoma 12/08/2021   Iron deficiency anemia due to chronic blood loss 06/29/2021   Menorrhagia with regular cycle 06/29/2021   Cervical polyp 06/29/2021   History of ectopic pregnancy 06/29/2021   Intramural leiomyoma of uterus 06/29/2021   Rheumatoid arthritis involving multiple sites with positive rheumatoid factor, positive anti-CCP, +14 33 eta 12/03/2016   Elevated rheumatoid factor 11/18/2016   Tobacco abuse 11/18/2016   History of abnormal electrocardiogram 11/18/2016   History of diarrhea 11/18/2016   Breast mass, right 10/21/2010    Past Medical History:  Diagnosis Date   Ectopic pregnancy    Rheumatoid arthritis ([place])     History reviewed. No pertinent family history. Past Surgical History:  Procedure Laterality Date   IR ANGIOGRAM PELVIS SELECTIVE OR SUPRASELECTIVE  12/08/2021   IR ANGIOGRAM PELVIS SELECTIVE OR SUPRASELECTIVE  12/08/2021   IR ANGIOGRAM SELECTIVE EACH ADDITIONAL VESSEL  12/08/2021   IR ANGIOGRAM SELECTIVE EACH ADDITIONAL VESSEL  12/08/2021  IR EMBO TUMOR ORGAN ISCHEMIA INFARCT INC GUIDE ROADMAPPING  12/08/2021   IR RADIOLOGIST EVAL & MGMT  09/01/2021   IR RADIOLOGIST EVAL & MGMT  01/02/2022   IR US GUIDE VASC ACCESS RIGHT  12/08/2021   LAPAROSCOPIC UNILATERAL SALPINGECTOMY Left 2000   UTERINE FIBROID EMBOLIZATION  12/09/2021   Social History   Social History Narrative   Not  on file   There is no immunization history for the selected administration types on file for this patient.   Objective: Vital Signs: BP 128/85 (BP Location: Left Arm, Patient Position: Sitting, Cuff Size: Normal)   Pulse 79   Resp 14   Ht 5\' 7"  (1.702 m)   Wt 167 lb (75.8 kg)   LMP 07/08/2022   BMI 26.16 kg/m    Physical Exam Vitals and nursing note reviewed.  Constitutional:      Appearance: She is well-developed.  HENT:     Head: Normocephalic and atraumatic.  Eyes:     Conjunctiva/sclera: Conjunctivae normal.  Cardiovascular:     Rate and Rhythm: Normal rate and regular rhythm.     Heart sounds: Normal heart sounds.  Pulmonary:     Effort: Pulmonary effort is normal.     Breath sounds: Normal breath sounds.  Abdominal:     General: Bowel sounds are normal.     Palpations: Abdomen is soft.  Musculoskeletal:     Cervical back: Normal range of motion.  Lymphadenopathy:     Cervical: No cervical adenopathy.  Skin:    General: Skin is warm and dry.     Capillary Refill: Capillary refill takes less than 2 seconds.  Neurological:     Mental Status: She is alert and oriented to person, place, and time.  Psychiatric:        Behavior: Behavior normal.      Musculoskeletal Exam: C-spine, thoracic spine, lumbar spine have good range of motion.  Shoulder joints, elbow joints, wrist joints, MCPs, PIPs, DIPs have good range of motion with no synovitis.  Complete fist formation bilaterally.  Hip joints have good range of motion with no groin pain.  Knee joints have good range of motion with no warmth or effusion.  Synovial thickening of the left ankle.  Some tenderness over the MTPs of the left foot.  Left great toe bunion noted.  CDAI Exam: CDAI Score: -- Patient Global: 2 mm; Provider Global: 2 mm Swollen: --; Tender: -- Joint Exam 07/08/2022   No joint exam has been documented for this visit   There is currently no information documented on the homunculus. Go to the  Rheumatology activity and complete the homunculus joint exam.  Investigation: No additional findings.  Imaging: No results found.  Recent Labs: Lab Results  Component Value Date   WBC 4.5 12/08/2021   HGB 10.4 (L) 12/08/2021   PLT 237 12/08/2021   NA 139 12/08/2021   K 3.6 12/08/2021   CL 107 12/08/2021   CO2 26 12/08/2021   GLUCOSE 88 12/08/2021   BUN 16 12/08/2021   CREATININE 0.77 12/08/2021   BILITOT 0.2 10/16/2021   ALKPHOS 43 02/19/2021   AST 14 10/16/2021   ALT 8 10/16/2021   PROT 6.7 10/16/2021   ALBUMIN 4.3 02/19/2021   CALCIUM 9.3 12/08/2021   GFRAA 127 09/24/2020   QFTBGOLD NEGATIVE 12/30/2016    Speciality Comments: PLQ Eye Exam: 09/16/2020 WNL @ [facility]  Procedures:  No procedures performed Allergies: Patient has no known allergies.   Assessment / Plan:  Visit Diagnoses: Rheumatoid arthritis involving multiple sites with positive rheumatoid factor (HCC) - +RF, +anti-CCP, +14-3-3 eta. 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  She has been out of Plaquenil since November 2023.  She previously was tolerating Plaquenil 200 mg 1 tablet by mouth twice daily Monday through Friday.  She typically does not have flares so she has not had her Plaquenil refilled.  She was overdue to update lab work, Plaquenil eye examination, and an office visit.  She recently had a flare involving the left foot.  She picked up a prescription for prednisone which was sent to the pharmacy at her last appointment on 10/16/2021.  The prednisone taper is for 20 mg tapering by 5 mg every 2 days.  She is currently taking prednisone 15 mg.  Her symptoms have subsided.  Patient was strongly encouraged to remain on Plaquenil as prescribed.  Discussed the importance of yearly Plaquenil eye examinations as well as routine lab work every 5 months.  She voiced understanding.  She will follow-up in the office in 5 months or sooner if needed.  High risk  medication use - Plaquenil 200 mg 1 tablet by mouth twice daily Monday through Friday.  CBC and CMP updated on 11/13/21.  Orders for CBC and CMP released today.  Discussed the importance of having updated lab work every 5 months. PLQ Eye Exam: 09/16/2020 WNL @ [facility].  Scheduled to update Plaquenil eye examination in April 2024.  Patient was given a Plaquenil eye examination form to take with her to her upcoming appointment.  Discussed the importance of yearly Plaquenil eye examinations to monitor the risk for Plaquenil toxicity. - Plan: CBC with Differential/Platelet, COMPLETE METABOLIC PANEL WITH GFR  Rash and other nonspecific skin eruption: Patient experiences intermittent rashes on her face and upper back.  No Malar rash noted.  No erythema on her upper back noted today.  Patient requested to establish care with dermatology.  A referral will be placed today for further evaluation.  Other medical conditions are listed as follows:  Tobacco abuse  Intramural leiomyoma of uterus  Cervical polyp  History of ectopic pregnancy  Iron deficiency anemia due to chronic blood loss: Iron panel will be checked today as requested.  History of anemia -Iron panel will be checked today.  Plan: Fe+TIBC+Fer  Orders: Orders Placed This Encounter  Procedures   CBC with Differential/Platelet   COMPLETE METABOLIC PANEL WITH GFR   Fe+TIBC+Fer   Meds ordered this encounter  Medications   hydroxychloroquine (PLAQUENIL) 200 MG tablet    Sig: Take 1 tablet by mouth twice daily, Monday through Friday only.    Dispense:  120 tablet    Refill:  0     Follow-Up Instructions: Return in about 5 months (around 12/08/2022) for Rheumatoid arthritis.   Ofilia Neas, PA-C  Note - This record has been created using Dragon software.  Chart creation errors have been sought, but may not always  have been located. Such creation errors do not reflect on  the standard of medical care.

## 2022-07-08 ENCOUNTER — Encounter: Payer: Self-pay | Admitting: Physician Assistant

## 2022-07-08 ENCOUNTER — Ambulatory Visit: Payer: No Typology Code available for payment source | Attending: Physician Assistant | Admitting: Physician Assistant

## 2022-07-08 VITALS — BP 128/85 | HR 79 | Resp 14 | Ht 67.0 in | Wt 167.0 lb

## 2022-07-08 DIAGNOSIS — N841 Polyp of cervix uteri: Secondary | ICD-10-CM

## 2022-07-08 DIAGNOSIS — Z79899 Other long term (current) drug therapy: Secondary | ICD-10-CM | POA: Diagnosis not present

## 2022-07-08 DIAGNOSIS — D500 Iron deficiency anemia secondary to blood loss (chronic): Secondary | ICD-10-CM

## 2022-07-08 DIAGNOSIS — M0579 Rheumatoid arthritis with rheumatoid factor of multiple sites without organ or systems involvement: Secondary | ICD-10-CM | POA: Diagnosis not present

## 2022-07-08 DIAGNOSIS — Z720 Tobacco use: Secondary | ICD-10-CM | POA: Diagnosis not present

## 2022-07-08 DIAGNOSIS — R21 Rash and other nonspecific skin eruption: Secondary | ICD-10-CM

## 2022-07-08 DIAGNOSIS — D251 Intramural leiomyoma of uterus: Secondary | ICD-10-CM

## 2022-07-08 DIAGNOSIS — Z862 Personal history of diseases of the blood and blood-forming organs and certain disorders involving the immune mechanism: Secondary | ICD-10-CM

## 2022-07-08 DIAGNOSIS — Z8759 Personal history of other complications of pregnancy, childbirth and the puerperium: Secondary | ICD-10-CM

## 2022-07-08 MED ORDER — HYDROXYCHLOROQUINE SULFATE 200 MG PO TABS: ORAL_TABLET | ORAL | 0 refills | Status: DC

## 2022-07-09 ENCOUNTER — Telehealth: Payer: Self-pay | Admitting: *Deleted

## 2022-07-09 DIAGNOSIS — R21 Rash and other nonspecific skin eruption: Secondary | ICD-10-CM

## 2022-07-09 LAB — COMPLETE METABOLIC PANEL WITH GFR
AG Ratio: 1.7 (calc) (ref 1.0–2.5)
ALT: 10 U/L (ref 6–29)
AST: 12 U/L (ref 10–35)
Albumin: 4.5 g/dL (ref 3.6–5.1)
Alkaline phosphatase (APISO): 43 U/L (ref 31–125)
BUN: 10 mg/dL (ref 7–25)
CO2: 28 mmol/L (ref 20–32)
Calcium: 9.5 mg/dL (ref 8.6–10.2)
Chloride: 106 mmol/L (ref 98–110)
Creat: 0.82 mg/dL (ref 0.50–0.99)
Globulin: 2.7 g/dL (calc) (ref 1.9–3.7)
Glucose, Bld: 82 mg/dL (ref 65–99)
Potassium: 3.7 mmol/L (ref 3.5–5.3)
Sodium: 142 mmol/L (ref 135–146)
Total Bilirubin: 0.4 mg/dL (ref 0.2–1.2)
Total Protein: 7.2 g/dL (ref 6.1–8.1)
eGFR: 88 mL/min/{1.73_m2} (ref >=60)

## 2022-07-09 LAB — CBC WITH DIFFERENTIAL/PLATELET
Absolute Monocytes: 568 cells/uL (ref 200–950)
Basophils Absolute: 43 cells/uL (ref 0–200)
Basophils Relative: 0.5 %
Eosinophils Absolute: 43 cells/uL (ref 15–500)
Eosinophils Relative: 0.5 %
HCT: 41.7 % (ref 35.0–45.0)
Hemoglobin: 13.7 g/dL (ref 11.7–15.5)
Lymphs Abs: 2872 cells/uL (ref 850–3900)
MCH: 30.1 pg (ref 27.0–33.0)
MCHC: 32.9 g/dL (ref 32.0–36.0)
MCV: 91.6 fL (ref 80.0–100.0)
MPV: 10.2 fL (ref 7.5–12.5)
Monocytes Relative: 6.6 %
Neutro Abs: 5074 cells/uL (ref 1500–7800)
Neutrophils Relative %: 59 %
Platelets: 272 10*3/uL (ref 140–400)
RBC: 4.55 10*6/uL (ref 3.80–5.10)
RDW: 13.2 % (ref 11.0–15.0)
Total Lymphocyte: 33.4 %
WBC: 8.6 10*3/uL (ref 3.8–10.8)

## 2022-07-09 LAB — IRON,TIBC AND FERRITIN PANEL
%SAT: 32 % (calc) (ref 16–45)
Ferritin: 15 ng/mL — ABNORMAL LOW (ref 16–232)
Iron: 108 ug/dL (ref 40–190)
TIBC: 342 mcg/dL (calc) (ref 250–450)

## 2022-07-09 NOTE — Telephone Encounter (Signed)
Referral to dermatology placed per Hazel Sams, PA-C

## 2022-07-09 NOTE — Progress Notes (Signed)
CBC and CMP WNL.   Ferritin remains borderline low but has improved. Iron WNL.

## 2022-08-04 ENCOUNTER — Other Ambulatory Visit: Payer: Self-pay | Admitting: Interventional Radiology

## 2022-08-04 DIAGNOSIS — D250 Submucous leiomyoma of uterus: Secondary | ICD-10-CM

## 2022-08-23 ENCOUNTER — Other Ambulatory Visit: Payer: No Typology Code available for payment source

## 2022-09-01 ENCOUNTER — Other Ambulatory Visit: Payer: No Typology Code available for payment source

## 2022-09-12 ENCOUNTER — Ambulatory Visit
Admission: RE | Admit: 2022-09-12 | Discharge: 2022-09-12 | Disposition: A | Discharge status: Home / Self Care | Payer: No Typology Code available for payment source | Source: Ambulatory Visit | Attending: Interventional Radiology | Admitting: Interventional Radiology

## 2022-09-12 DIAGNOSIS — D250 Submucous leiomyoma of uterus: Secondary | ICD-10-CM

## 2022-09-12 MED ORDER — GADOPICLENOL 0.5 MMOL/ML IV SOLN
7.5000 mL | Freq: Once | INTRAVENOUS | Status: AC | PRN
  Administered 2022-09-12: 7.5 mL via INTRAVENOUS

## 2022-09-17 ENCOUNTER — Inpatient Hospital Stay
Admission: RE | Admit: 2022-09-17 | Discharge: 2022-09-17 | Disposition: A | Discharge status: Home / Self Care | Payer: No Typology Code available for payment source | Source: Ambulatory Visit | Attending: Interventional Radiology | Admitting: Interventional Radiology

## 2022-10-16 NOTE — Telephone Encounter (Signed)
Contacted the patient and advised the office has closed and that I would message her Doctor so that we could get started on the form Monday. Patient verbalized understanding.

## 2022-10-18 NOTE — Telephone Encounter (Signed)
Ok to complete form?  

## 2022-10-20 ENCOUNTER — Encounter: Payer: Self-pay | Admitting: Family

## 2022-10-20 ENCOUNTER — Other Ambulatory Visit (HOSPITAL_COMMUNITY)
Admission: RE | Admit: 2022-10-20 | Discharge: 2022-10-20 | Disposition: A | Discharge status: Home / Self Care | Payer: No Typology Code available for payment source | Source: Ambulatory Visit | Attending: Family | Admitting: Family

## 2022-10-20 ENCOUNTER — Encounter: Payer: Self-pay | Admitting: Nurse Practitioner

## 2022-10-20 ENCOUNTER — Ambulatory Visit (INDEPENDENT_AMBULATORY_CARE_PROVIDER_SITE_OTHER): Payer: No Typology Code available for payment source | Admitting: Family

## 2022-10-20 VITALS — BP 142/88 | HR 71 | Temp 98.5°F | Ht 67.0 in | Wt 169.2 lb

## 2022-10-20 DIAGNOSIS — Z0130 Encounter for examination of blood pressure without abnormal findings: Secondary | ICD-10-CM

## 2022-10-20 DIAGNOSIS — D229 Melanocytic nevi, unspecified: Secondary | ICD-10-CM

## 2022-10-20 DIAGNOSIS — B9689 Other specified bacterial agents as the cause of diseases classified elsewhere: Secondary | ICD-10-CM

## 2022-10-20 DIAGNOSIS — Z113 Encounter for screening for infections with a predominantly sexual mode of transmission: Secondary | ICD-10-CM | POA: Diagnosis present

## 2022-10-20 DIAGNOSIS — N760 Acute vaginitis: Secondary | ICD-10-CM

## 2022-10-20 LAB — POCT URINALYSIS DIP (CLINITEK)
Bilirubin, UA: NEGATIVE
Glucose, UA: NEGATIVE mg/dL
Ketones, POC UA: NEGATIVE mg/dL
Leukocytes, UA: NEGATIVE
Nitrite, UA: NEGATIVE
POC PROTEIN,UA: NEGATIVE
Spec Grav, UA: 1.025 (ref 1.010–1.025)
Urobilinogen, UA: 0.2 E.U./dL
pH, UA: 7.5 (ref 5.0–8.0)

## 2022-10-20 MED ORDER — METRONIDAZOLE 500 MG PO TABS: 500.0000 mg | ORAL_TABLET | Freq: Two times a day (BID) | ORAL | 0 refills | Status: AC

## 2022-10-20 NOTE — Progress Notes (Signed)
Patient ID: Alison Lowery, female    DOB: 01-02-1976  MRN: [id number]  CC: Follow-Up  Subjective: Alison Lowery is a 47 y.o. female who presents for follow-up.   Her concerns today include:  - Mole of corner of right eye increasing in size. States the mole is tender and the right eye has "stringy slime" and sometimes "eye crustiness". Also, noticed "small white bumps" on the onside of right eyelid. She has normal vision bilaterally. Denies eye pain bilaterally. Reports she tried to get an appointment with Dermatology but unable to do so due to long wait.  - History of bacterial vaginitis. Would like screening.  - No further issues/concerns for discussion today.    Patient Active Problem List   Diagnosis Date Noted   Uterine leiomyoma 12/08/2021   Iron deficiency anemia due to chronic blood loss 06/29/2021   Menorrhagia with regular cycle 06/29/2021   Cervical polyp 06/29/2021   History of ectopic pregnancy 06/29/2021   Intramural leiomyoma of uterus 06/29/2021   Rheumatoid arthritis involving multiple sites with positive rheumatoid factor, positive anti-CCP, +14 33 eta 12/03/2016   Elevated rheumatoid factor 11/18/2016   Tobacco abuse 11/18/2016   History of abnormal electrocardiogram 11/18/2016   History of diarrhea 11/18/2016   Breast mass, right 10/21/2010     Current Outpatient Medications on File Prior to Visit  Medication Sig Dispense Refill   hydroxychloroquine (PLAQUENIL) 200 MG tablet Take 1 tablet by mouth twice daily, Monday through Friday only. 120 tablet 0   docusate sodium (COLACE) 100 MG capsule Take 1 capsule (100 mg total) by mouth 2 (two) times daily. (Patient not taking: Reported on 07/08/2022) 10 capsule 0   HYDROcodone-acetaminophen (NORCO/VICODIN) 5-325 MG tablet Take 1-2 tablets by mouth every 6 (six) hours as needed for moderate pain. (Patient not taking: Reported on 07/08/2022) 30 tablet 0   ibuprofen (ADVIL) 200 MG tablet Take 600 mg by mouth as needed  for cramping. (Patient not taking: Reported on 07/08/2022)     metroNIDAZOLE (METROGEL) 0.75 % vaginal gel Place 1 Applicatorful vaginally at bedtime. Use for 5 nights. (Patient not taking: Reported on 10/20/2022) 70 g 0   predniSONE (DELTASONE) 5 MG tablet Take by mouth. (Patient not taking: Reported on 10/20/2022)     promethazine (PHENERGAN) 25 MG tablet Take 1 tablet (25 mg total) by mouth every 8 (eight) hours as needed for nausea. (Patient not taking: Reported on 07/08/2022) 30 tablet 0   No current facility-administered medications on file prior to visit.    No Known Allergies  Social History   Socioeconomic History   Marital status: Single    Spouse name: Not on file   Number of children: 2   Years of education: Not on file   Highest education level: Not on file  Occupational History   Not on file  Tobacco Use   Smoking status: Every Day    Packs/day: 0.50    Years: 15.00    Additional pack years: 0.00    Total pack years: 7.50    Types: Cigarettes    Passive exposure: Never   Smokeless tobacco: Never  Vaping Use   Vaping Use: Never used  Substance and Sexual Activity   Alcohol use: Yes    Comment: occ   Drug use: No   Sexual activity: Yes    Partners: Male    Birth control/protection: None  Other Topics Concern   Not on file  Social History Narrative   Not on file  Social Determinants of Health   Financial Resource Strain: Low Risk  (08/24/2018)   Overall Financial Resource Strain (CARDIA)    Difficulty of Paying Living Expenses: Not hard at all  Food Insecurity: No Food Insecurity (08/24/2018)   Hunger Vital Sign    Worried About Running Out of Food in the Last Year: Never true    Ran Out of Food in the Last Year: Never true  Transportation Needs: No Transportation Needs (08/24/2018)   PRAPARE - Administrator, Civil Service (Medical): No    Lack of Transportation (Non-Medical): No  Physical Activity: Inactive (08/24/2018)   Exercise Vital Sign     Days of Exercise per Week: 0 days    Minutes of Exercise per Session: 0 min  Stress: No Stress Concern Present (08/24/2018)   [employer] of Occupational Health - Occupational Stress Questionnaire    Feeling of Stress : Not at all  Social Connections: Moderately Isolated (08/24/2018)   Social Connection and Isolation Panel [NHANES]    Frequency of Communication with Friends and Family: Twice a week    Frequency of Social Gatherings with Friends and Family: Twice a week    Attends Religious Services: Never    Database administrator or Organizations: No    Attends Banker Meetings: Never    Marital Status: Never married  Intimate Partner Violence: Not At Risk (08/24/2018)   Humiliation, Afraid, Rape, and Kick questionnaire    Fear of Current or Ex-Partner: No    Emotionally Abused: No    Physically Abused: No    Sexually Abused: No    No family history on file.  Past Surgical History:  Procedure Laterality Date   IR ANGIOGRAM PELVIS SELECTIVE OR SUPRASELECTIVE  12/08/2021   IR ANGIOGRAM PELVIS SELECTIVE OR SUPRASELECTIVE  12/08/2021   IR ANGIOGRAM SELECTIVE EACH ADDITIONAL VESSEL  12/08/2021   IR ANGIOGRAM SELECTIVE EACH ADDITIONAL VESSEL  12/08/2021   IR EMBO TUMOR ORGAN ISCHEMIA INFARCT INC GUIDE ROADMAPPING  12/08/2021   IR RADIOLOGIST EVAL & MGMT  09/01/2021   IR RADIOLOGIST EVAL & MGMT  01/02/2022   IR US GUIDE VASC ACCESS RIGHT  12/08/2021   LAPAROSCOPIC UNILATERAL SALPINGECTOMY Left 2000   UTERINE FIBROID EMBOLIZATION  12/09/2021    ROS: Review of Systems Negative except as stated above  PHYSICAL EXAM: BP (!) 142/88   Pulse 71   Temp 98.5 F (36.9 C) (Oral)   Ht 5\' 7"  (1.702 m)   Wt 169 lb 3.2 oz (76.7 kg)   SpO2 94%   BMI 26.50 kg/m   Physical Exam HENT:     Head: Normocephalic and atraumatic.     Nose: Nose normal.     Mouth/Throat:     Mouth: Mucous membranes are moist.     Pharynx: Oropharynx is clear.  Eyes:     Extraocular  Movements: Extraocular movements intact.     Conjunctiva/sclera: Conjunctivae normal.     Pupils: Pupils are equal, round, and reactive to light.     Comments: Mole right corner of eye. Several pinpoint size bumps of right external eyelid. No additional presentation.  Cardiovascular:     Rate and Rhythm: Normal rate and regular rhythm.     Pulses: Normal pulses.     Heart sounds: Normal heart sounds.  Pulmonary:     Effort: Pulmonary effort is normal.     Breath sounds: Normal breath sounds.  Musculoskeletal:     Cervical back: Normal range of motion and  neck supple.  Neurological:     General: No focal deficit present.     Mental Status: She is alert and oriented to person, place, and time.  Psychiatric:        Mood and Affect: Mood normal.        Behavior: Behavior normal.     ASSESSMENT AND PLAN: 1. Skin mole - Referral to Dermatology for further evaluation/management.  - Ambulatory referral to Dermatology  2. Bacterial vaginitis - Empiric treatment with Metronidazole as prescribed. Counseled on medication adherence.  - Routine screening.  - Cervicovaginal ancillary only - metroNIDAZOLE (FLAGYL) 500 MG tablet; Take 1 tablet (500 mg total) by mouth 2 (two) times daily for 7 days.  Dispense: 14 tablet; Refill: 0  3. Routine screening for STI (sexually transmitted infection) - Routine screening.  - POCT URINALYSIS DIP (CLINITEK) - Cervicovaginal ancillary only  4. Blood pressure check - Blood pressure not at goal during today's visit. Patient asymptomatic without chest pressure, chest pain, palpitations, shortness of breath, worst headache of life, and any additional red flag symptoms. - Follow-up with primary provider in 2 to 4 weeks or sooner if needed for blood pressure check.     Patient was given the opportunity to ask questions.  Patient verbalized understanding of the plan and was able to repeat key elements of the plan. Patient was given clear instructions to go to  Emergency Department or return to medical center if symptoms don't improve, worsen, or new problems develop.The patient verbalized understanding.   Orders Placed This Encounter  Procedures   Ambulatory referral to Dermatology   POCT URINALYSIS DIP (CLINITEK)    Requested Prescriptions   Signed Prescriptions Disp Refills   metroNIDAZOLE (FLAGYL) 500 MG tablet 14 tablet 0    Sig: Take 1 tablet (500 mg total) by mouth 2 (two) times daily for 7 days.    Follow-up with primary provider as scheduled.   Rema Fendt, NP

## 2022-10-20 NOTE — Progress Notes (Signed)
Pt states mole near right eye, causing sensitivity to right eye. She states having slime/droop coming out of eyes. Pt states white bums on outer eye lid.  Pt states Bacterial Vaginitis.

## 2022-10-21 LAB — CERVICOVAGINAL ANCILLARY ONLY
Bacterial Vaginitis (gardnerella): POSITIVE — AB
Candida Glabrata: NEGATIVE
Candida Vaginitis: NEGATIVE
Chlamydia: NEGATIVE
Comment: NEGATIVE
Comment: NEGATIVE
Comment: NEGATIVE
Comment: NEGATIVE
Comment: NEGATIVE
Comment: NORMAL
Neisseria Gonorrhea: NEGATIVE
Trichomonas: NEGATIVE

## 2022-10-26 ENCOUNTER — Encounter: Payer: Self-pay | Admitting: Nurse Practitioner

## 2022-12-07 ENCOUNTER — Ambulatory Visit
Admission: RE | Admit: 2022-12-07 | Discharge: 2022-12-07 | Disposition: A | Discharge status: Home / Self Care | Payer: No Typology Code available for payment source | Source: Ambulatory Visit | Attending: Physician Assistant | Admitting: Physician Assistant

## 2022-12-07 VITALS — BP 130/80 | HR 107 | Temp 98.1°F | Resp 18

## 2022-12-07 DIAGNOSIS — M549 Dorsalgia, unspecified: Secondary | ICD-10-CM | POA: Diagnosis not present

## 2022-12-07 MED ORDER — CYCLOBENZAPRINE HCL 10 MG PO TABS: 10.0000 mg | ORAL_TABLET | Freq: Two times a day (BID) | ORAL | 0 refills | Status: DC | PRN

## 2022-12-07 MED ORDER — PREDNISONE 20 MG PO TABS: 40.0000 mg | ORAL_TABLET | Freq: Every day | ORAL | 0 refills | Status: AC
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
## 2023-01-06 ENCOUNTER — Telehealth: Payer: Self-pay | Admitting: Rheumatology

## 2023-01-06 NOTE — Telephone Encounter (Signed)
Pt asking for a return call asking about FMLA.

## 2023-01-07 NOTE — Telephone Encounter (Signed)
Attempted to contact the patient, went straight to recording stating the voicemail has not been set up yet.
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
## 2023-02-02 ENCOUNTER — Ambulatory Visit (INDEPENDENT_AMBULATORY_CARE_PROVIDER_SITE_OTHER): Payer: No Typology Code available for payment source | Admitting: Dermatology

## 2023-02-02 ENCOUNTER — Encounter: Payer: Self-pay | Admitting: Dermatology

## 2023-02-02 VITALS — BP 139/98 | HR 83

## 2023-02-02 DIAGNOSIS — L720 Epidermal cyst: Secondary | ICD-10-CM

## 2023-02-02 DIAGNOSIS — D492 Neoplasm of unspecified behavior of bone, soft tissue, and skin: Secondary | ICD-10-CM | POA: Diagnosis not present

## 2023-02-02 DIAGNOSIS — D485 Neoplasm of uncertain behavior of skin: Secondary | ICD-10-CM

## 2023-02-02 DIAGNOSIS — B078 Other viral warts: Secondary | ICD-10-CM

## 2023-02-02 NOTE — Patient Instructions (Addendum)

## 2023-02-02 NOTE — Progress Notes (Signed)
   New Patient Visit   Subjective  Alison Lowery is a 47 y.o. female who presents for the following: growth on right medial canthus, present for several years, not previously treated.  Pt has growth on inner corner of right eye that seems to be growing and is bothersome. She would like it evaluated.   The following portions of the chart were reviewed this encounter and updated as appropriate: medications, allergies, medical history  Review of Systems:  No other skin or systemic complaints except as noted in HPI or Assessment and Plan.  Objective  Well appearing patient in no apparent distress; mood and affect are within normal limits.  A focused examination was performed of the following areas: face  Relevant exam findings are noted in the Assessment and Plan.  right medial canthus 0.2 cm Firm pedunculated papule           Assessment & Plan    EPIDERMAL INCLUSION CYST Exam: Subcutaneous nodule at upper lip  Benign-appearing. Exam most consistent with an epidermal inclusion cyst. Discussed that a cyst is a benign growth that can grow over time and sometimes get irritated or inflamed. Recommend observation if it is not bothersome. Discussed option of surgical excision to remove it if it is growing, symptomatic, or other changes noted. Please call for new or changing lesions so they can be evaluated.  Neoplasm of uncertain behavior of skin right medial canthus  Skin / nail biopsy Type of biopsy: tangential   Informed consent: discussed and consent obtained   Instrument used: DermaBlade   Post-procedure details: wound care instructions given    Specimen 1 - Surgical pathology Differential Diagnosis: R/O verruca vs other  Check Margins: No    Return if symptoms worsen or fail to improve.  I, Tillie Fantasia, CMA, am acting as scribe for Gwenith Daily, MD.   Documentation: I have reviewed the above documentation for accuracy and completeness, and I agree with the  above.  Gwenith Daily, MD

## 2023-02-04 ENCOUNTER — Encounter: Payer: Self-pay | Admitting: Physician Assistant

## 2023-02-04 ENCOUNTER — Ambulatory Visit: Payer: No Typology Code available for payment source | Attending: Physician Assistant | Admitting: Physician Assistant

## 2023-02-04 VITALS — BP 130/90 | HR 80 | Resp 12 | Ht 67.0 in | Wt 167.4 lb

## 2023-02-04 DIAGNOSIS — D500 Iron deficiency anemia secondary to blood loss (chronic): Secondary | ICD-10-CM

## 2023-02-04 DIAGNOSIS — D251 Intramural leiomyoma of uterus: Secondary | ICD-10-CM

## 2023-02-04 DIAGNOSIS — Z79899 Other long term (current) drug therapy: Secondary | ICD-10-CM

## 2023-02-04 DIAGNOSIS — M0579 Rheumatoid arthritis with rheumatoid factor of multiple sites without organ or systems involvement: Secondary | ICD-10-CM | POA: Diagnosis not present

## 2023-02-04 DIAGNOSIS — Z8759 Personal history of other complications of pregnancy, childbirth and the puerperium: Secondary | ICD-10-CM

## 2023-02-04 DIAGNOSIS — Z720 Tobacco use: Secondary | ICD-10-CM | POA: Diagnosis not present

## 2023-02-04 DIAGNOSIS — N841 Polyp of cervix uteri: Secondary | ICD-10-CM

## 2023-02-04 LAB — SURGICAL PATHOLOGY

## 2023-02-04 MED ORDER — HYDROXYCHLOROQUINE SULFATE 200 MG PO TABS: ORAL_TABLET | ORAL | 0 refills | Status: AC

## 2023-03-03 ENCOUNTER — Encounter: Payer: Self-pay | Admitting: Nurse Practitioner

## 2023-03-03 NOTE — Telephone Encounter (Signed)
Telephone call  

## 2023-06-30 ENCOUNTER — Ambulatory Visit (INDEPENDENT_AMBULATORY_CARE_PROVIDER_SITE_OTHER): Payer: No Typology Code available for payment source | Admitting: Dermatology

## 2023-06-30 ENCOUNTER — Encounter: Payer: Self-pay | Admitting: Dermatology

## 2023-06-30 VITALS — BP 123/84

## 2023-06-30 DIAGNOSIS — L680 Hirsutism: Secondary | ICD-10-CM | POA: Diagnosis not present

## 2023-06-30 DIAGNOSIS — L700 Acne vulgaris: Secondary | ICD-10-CM | POA: Diagnosis not present

## 2023-06-30 MED ORDER — SPIRONOLACTONE 100 MG PO TABS: 100.0000 mg | ORAL_TABLET | Freq: Every day | ORAL | 2 refills | Status: DC

## 2023-06-30 MED ORDER — TRETINOIN 0.025 % EX CREA: TOPICAL_CREAM | CUTANEOUS | 1 refills | Status: DC

## 2023-06-30 NOTE — Progress Notes (Signed)
   New Patient Visit   Subjective  Alison Lowery is a 48 y.o. female who presents for the following: acne  Patient states she has acne located at the face that she would like to have examined. Patient reports the areas have been there for 1 year. She reports the areas are bothersome as her break outs are cystic. Patient rates irritation 8 out of 10. She states that the areas have not spread. Patient reports she has not previously been treated for these areas but she has tried different OTC BP and SA products but was not successful. Patient denies Hx of bx. Patient denies family history of skin cancer(s).   The following portions of the chart were reviewed this encounter and updated as appropriate: medications, allergies, medical history  Review of Systems:  No other skin or systemic complaints except as noted in HPI or Assessment and Plan.  Objective  Well appearing patient in no apparent distress; mood and affect are within normal limits.   A focused examination was performed of the following areas: face   Relevant exam findings are noted in the Assessment and Plan.           Assessment & Plan   ACNE VULGARIS Exam: Open comedones and inflammatory papules. NO active lesions today. PIH present from old acne flares.   - Assessment: Patient has been using benzoyl peroxide and salicylic acid with some results, and recently switched to Differin (adapalene). Current examination reveals no active lesions, but post-inflammatory hyperpigmentation and acne scarring are present.  flared  Treatment Plan: - Rx Tretinoin 0.025% - use pea size amount 3x week at night on MWF. - Pt informed to using gentle cleansers in the AM and PM followed by moisturizing. Samples of LRP and CeraVe cleansers & moisturizers provided.  - Rx Spirolactone to take daily. Pt informed of she feels any lightheadedness to let us know.    2. Hirsutism - Assessment: Patient reports unwanted hair growth. The  clinician has determined this to be hirsutism.  - Plan:    Start spironolactone 100 mg daily    Monitor for side effects such as lightheadedness or dizziness    Consider electrolysis or ND-YAG laser hair removal for long-term management    Patient education provided on expected outcomes and potential side effects ACNE VULGARIS   Related Medications tretinoin (RETIN-A) 0.025 % cream Use pea size at night on MWF.  Return in about 4 months (around 10/30/2023) for acne.    Documentation: I have reviewed the above documentation for accuracy and completeness, and I agree with the above.   I, Shirron Marcha Solders, CMA, am acting as scribe for [employer], DO.   Langston Reusing, DO

## 2023-06-30 NOTE — Patient Instructions (Addendum)
 Hello Denina,  Thank you for visiting my office today.   Here's a summary of the key instructions we discussed:  Diagnosis: Acne, Hyperpigmentation, Unwanted Hair  - Medications and Usage:   - Tretinoin: Apply topically 3 nights a week to prevent acne and reduce irritation.   - Benzoyl Peroxide Wash: Use in the morning.   - Spironolactone: Start with 100 mg daily to help with acne and hirsutism. Monitor for any signs of lightheadedness or dizziness.  - Skincare Routine:   - Morning: Use benzoyl peroxide wash followed by a moisturizer with sunscreen.   - Night: On treatment nights, use a regular wash followed by tretinoin. On non-treatment nights, just wash and moisturize.  - Additional Recommendations:   - Consider electrolysis or laser hair removal (specifically ND-YAG laser) for hirsutism.   - If you encounter any issues with obtaining medications, please send a message via MyChart.  - Follow-Up:   - Schedule a follow-up appointment in 4 months to assess progress and make any necessary adjustments.  We have provided samples of CeraVe and La Roche-Posay moisturizers and washes for you to try. Please feel free to reach out if you have any questions or concerns.  Warm regards,  Dr. [facility]    Important Information  Due to recent changes in healthcare laws, you may see results of your pathology and/or laboratory studies on MyChart before the doctors have had a chance to review them. We understand that in some cases there may be results that are confusing or concerning to you. Please understand that not all results are received at the same time and often the doctors may need to interpret multiple results in order to provide you with the best plan of care or course of treatment. Therefore, we ask that you please give [place] 2 business days to thoroughly review all your results before contacting the office for clarification. Should we see a critical lab result, you will  be contacted sooner.   If You Need Anything After Your Visit  If you have any questions or concerns for your doctor, please call our main line at [phone] If no one answers, please leave a voicemail as directed and we will return your call as soon as possible. Messages left after 4 pm will be answered the following business day.   You may also send [place] a message via MyChart. We typically respond to MyChart messages within 1-2 business days.  For prescription refills, please ask your pharmacy to contact our office. Our fax number is [phone].  If you have an urgent issue when the clinic is closed that cannot wait until the next business day, you can page your doctor at the number below.    Please note that while we do our best to be available for urgent issues outside of office hours, we are not available 24/7.   If you have an urgent issue and are unable to reach [place], you may choose to seek medical care at your doctor's office, retail clinic, urgent care center, or emergency room.  If you have a medical emergency, please immediately call 911 or go to the emergency department. In the event of inclement weather, please call our main line at [phone] for an update on the status of any delays or closures.  Dermatology Medication Tips: Please keep the boxes that topical medications come in in order to help keep track of the instructions about where and how to use these. Pharmacies typically print the medication instructions  only on the boxes and not directly on the medication tubes.   If your medication is too expensive, please contact our office at [phone] or send [place] a message through MyChart.   We are unable to tell what your co-pay for medications will be in advance as this is different depending on your insurance coverage. However, we may be able to find a substitute medication at lower cost or fill out paperwork to get insurance to cover a needed medication.   If a prior  authorization is required to get your medication covered by your insurance company, please allow [place] 1-2 business days to complete this process.  Drug prices often vary depending on where the prescription is filled and some pharmacies may offer cheaper prices.  The website www.goodrx.com contains coupons for medications through different pharmacies. The prices here do not account for what the cost may be with help from insurance (it may be cheaper with your insurance), but the website can give you the price if you did not use any insurance.  - You can print the associated coupon and take it with your prescription to the pharmacy.  - You may also stop by our office during regular business hours and pick up a GoodRx coupon card.  - If you need your prescription sent electronically to a different pharmacy, notify our office through [facility] or by phone at [phone]

## 2023-08-13 IMAGING — MR MR PELVIS WO/W CM
12 of 18 series · 31 of 48 positions shown · IV contrast (14 ML MULTIHANCE)
Comparison: None Available.

CLINICAL DATA: Symptomatic uterine fibroids.  Treatment planning.

EXAM:
MRI PELVIS WITHOUT AND WITH CONTRAST
TECHNIQUE: Multiplanar multisequence MR imaging of the pelvis was performed
both before and after administration of intravenous contrast.
CONTRAST:  14mL MULTIHANCE GADOBENATE DIMEGLUMINE 529 MG/ML IV SOLN

[Series 3: T2 · coronal · 6.0mm · 1.56mm/px · 1 of 35 slices shown (1 of 4)]
[im 1/35]
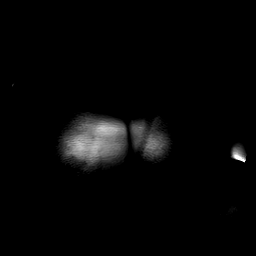

[Series 4: T2 · axial · 5.0mm · 0.98mm/px · 1 of 39 slices shown (2 of 4)]
[im 1/39]
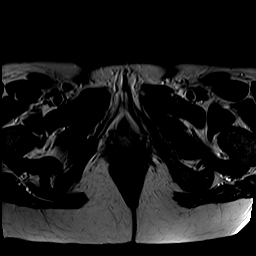

[Series 5: T2 fat-sat · axial · 5.0mm · 0.98mm/px · 1 of 39 slices shown]
[im 1/39]
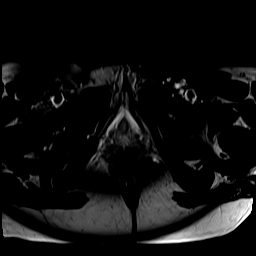

[Series 6: T2 · sagittal · 5.0mm · 1.02mm/px · 1 of 31 slices shown (3 of 4)]
[im 1/31]
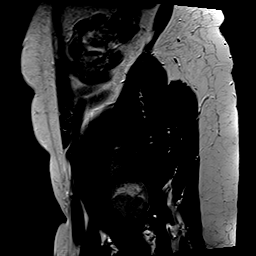

[Series 7: T2 · coronal · 3.0mm · 1.02mm/px · 3 of 57 slices shown (4 of 4)]
[im 1/57]
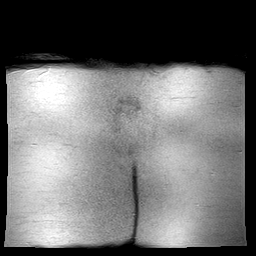
[im 29/57]
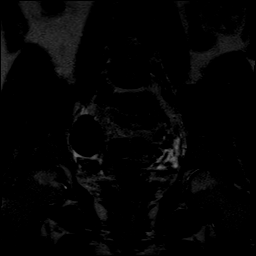
[im 57/57]
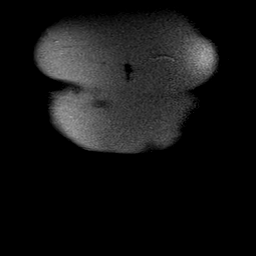

[Series 8: DWI · axial · 5.0mm · 1.88mm/px · z∈[-112,+152]mm · 6 of 135 slices shown]
[im 1/135]
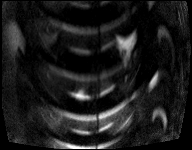
[im 27/135]
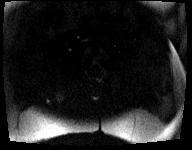
[im 54/135]
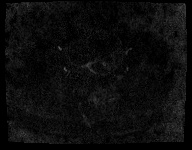
[im 81/135]
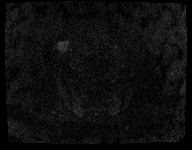
[im 108/135]
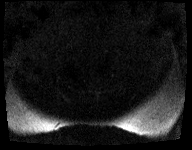
[im 135/135]
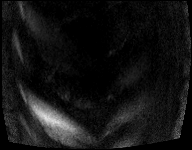

[Series 9: axial dwi_adc · axial · 5.0mm · 1.88mm/px · z∈[-112,+152]mm · 2 of 45 slices shown]
[im 1/45]
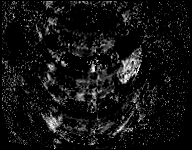
[im 45/45]
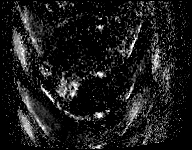

[Series 10: axial in out · axial · 5.5mm · 0.74mm/px · z∈[-95,+139]mm · 4 of 76 slices shown]
[im 1/76]
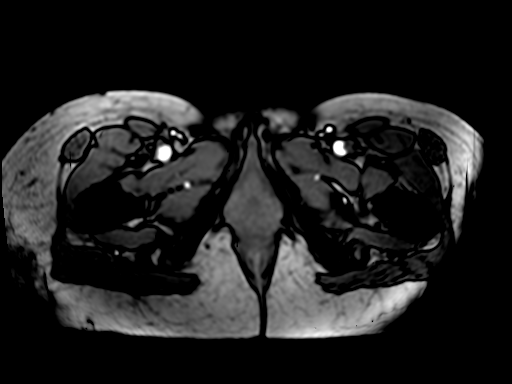
[im 26/76]
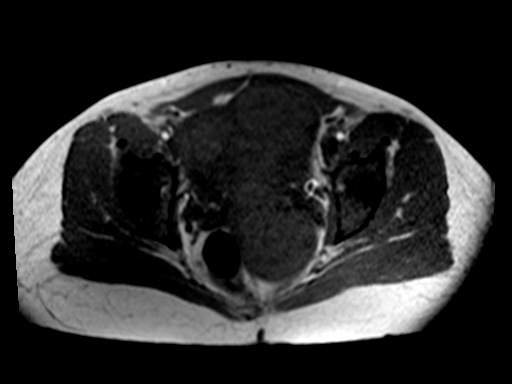
[im 51/76]
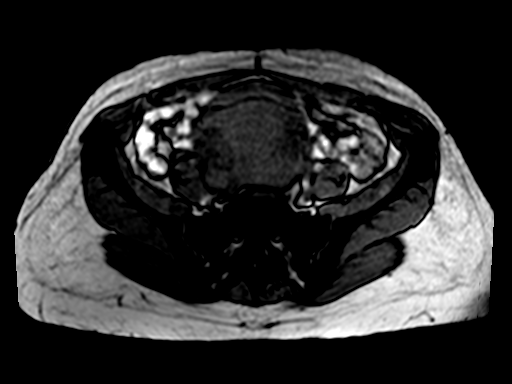
[im 76/76]
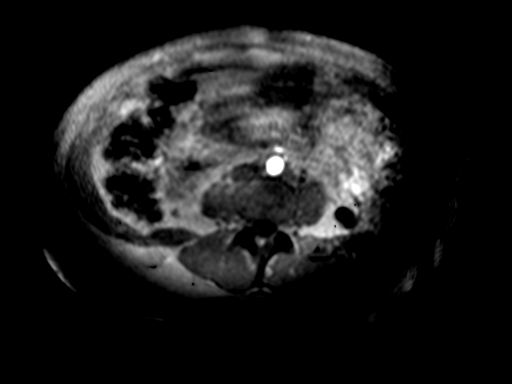

[Series 11: T1 dynamic · axial · non-contrast · 4.0mm · 0.49mm/px · z∈[-81,+139]mm · 3 of 56 slices shown (1 of 2)]
[im 1/56]
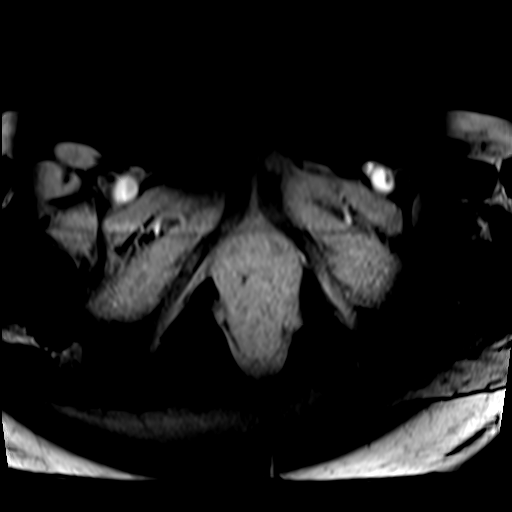
[im 28/56]
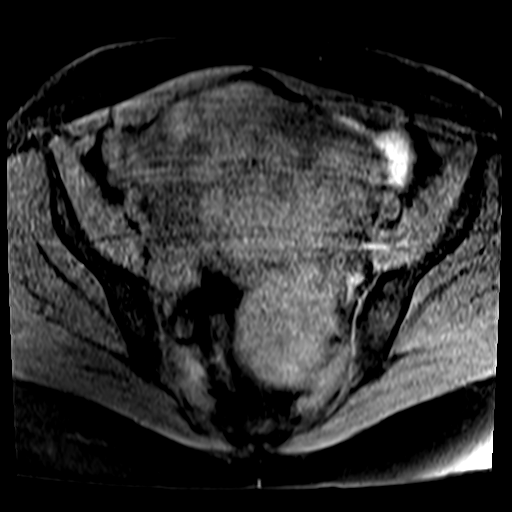
[im 56/56]
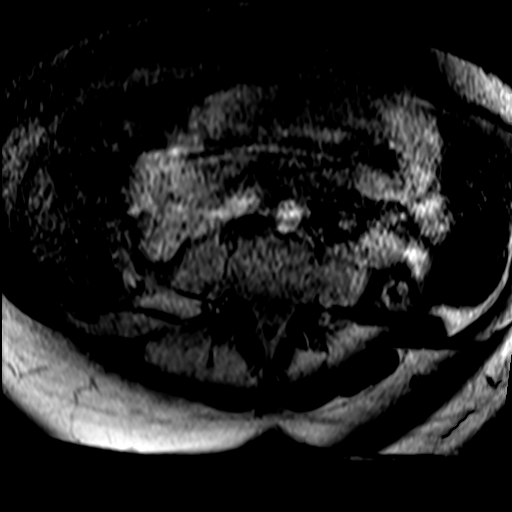

[Series 12: T1 dynamic post-contrast · axial · 4.0mm · 0.49mm/px · z∈[-81,+139]mm · 3 of 56 slices shown (1 of 2)]
[im 1/56]
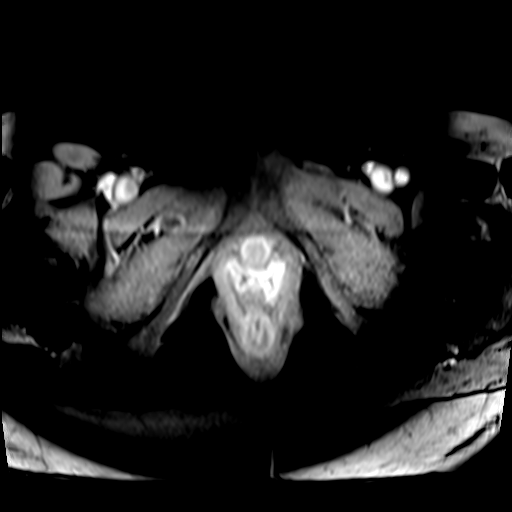
[im 28/56]
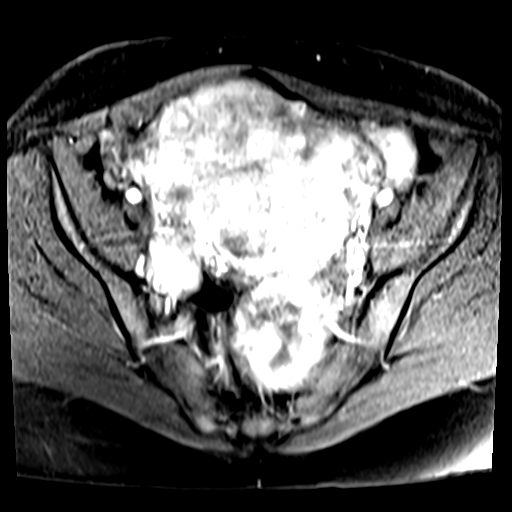
[im 56/56]
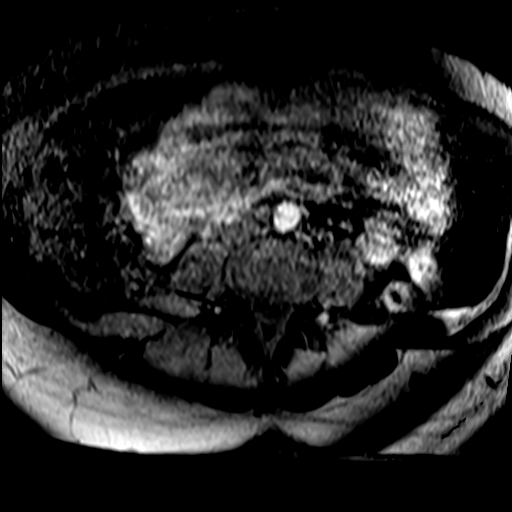

[Series 13: T1 dynamic · axial · 4.0mm · 0.49mm/px · z∈[-81,+139]mm · 3 of 56 slices shown (2 of 2)]
[im 1/56]
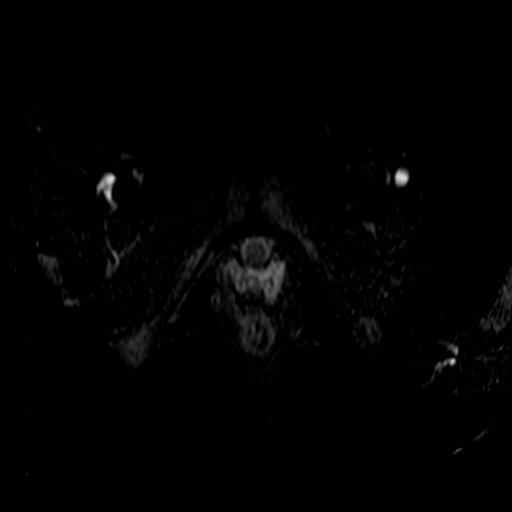
[im 28/56]
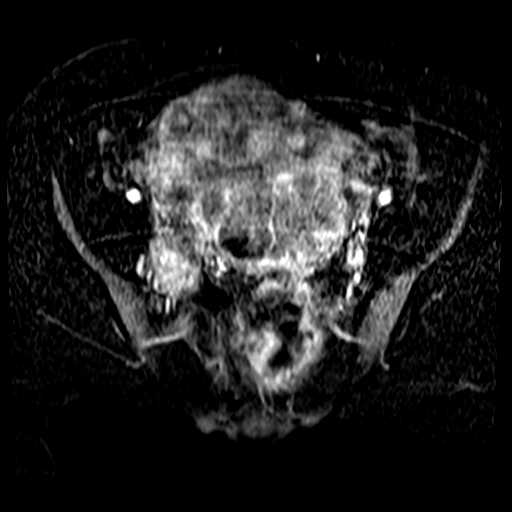
[im 56/56]
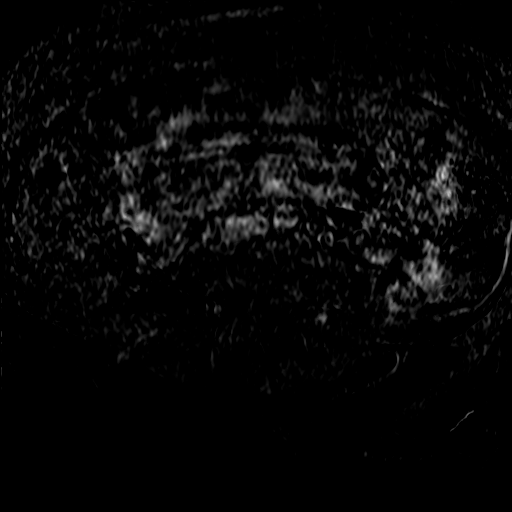

[Series 14: T1 dynamic post-contrast · axial · 4.0mm · 0.49mm/px · z∈[-81,+139]mm · 3 of 56 slices shown (2 of 2)]
[im 1/56]
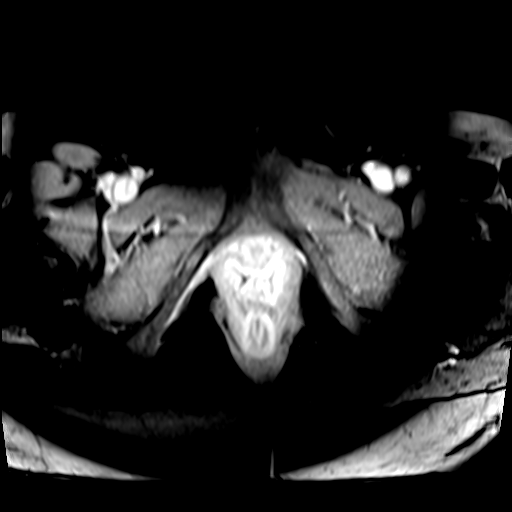
[im 28/56]
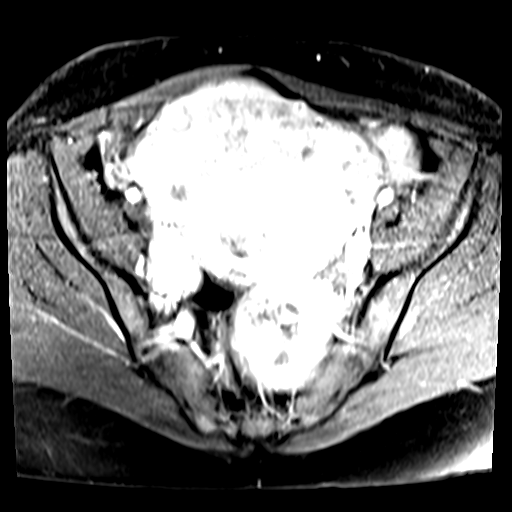
[im 56/56]
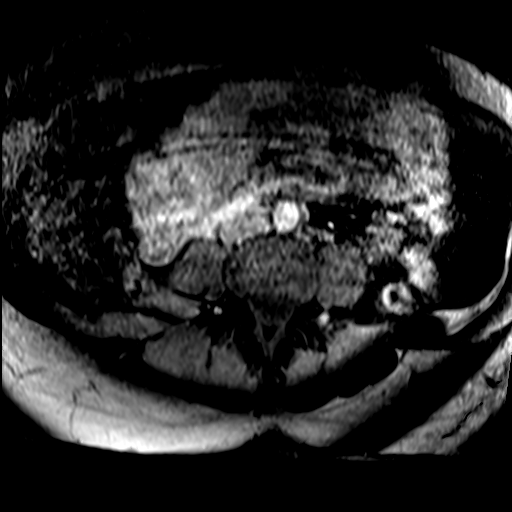

[31 of 48 positions shown; findings below may reference images not displayed]

FINDINGS: Lower Urinary Tract: No bladder or urethral abnormality identified.

Bowel:  Unremarkable visualized pelvic bowel loops.

Vascular/Lymphatic: No pathologically enlarged lymph nodes or other
significant abnormality.

Reproductive:

-- Uterus: Measures 16.8 x 11.7 by 12.7 cm (volume = 7674 cm^3).
Innumerable fibroids are seen throughout the uterus which range in
size from less than 1 cm to 8 cm in maximum diameter. These fibroids
are submucosal, intramural, and subserosal in location. Largest
fibroid is subserosal in location and arises from the posterior
lower uterine segment, measuring 8.0 x 6.6 x 7.2 cm. This fibroid
has a broad soft tissue pedicle of attachment measuring
approximately 5.5 cm in width.

-- Intracavitary fibroids:  None.

-- Pedunculated fibroids: None.

-- Fibroid contrast enhancement: All fibroids show contrast
enhancement. The dominant subserosal fibroid in the posterior lower
uterine segment shows central internal degeneration, but persistent
enhancement of approximately 50% of its volume peripherally.

-- Right ovary:  Not visualized, however no adnexal mass identified.

-- Left ovary:  Not visualized, however no adnexal mass identified.

Other: No abnormal free fluid.

Musculoskeletal:  Unremarkable.
IMPRESSION: Markedly enlarged uterus with diffuse involvement by fibroids,
largest measuring 8 cm. No intracavitary or pedunculated fibroids
identified.

## 2024-01-04 ENCOUNTER — Ambulatory Visit (INDEPENDENT_AMBULATORY_CARE_PROVIDER_SITE_OTHER): Admitting: Dermatology

## 2024-01-04 ENCOUNTER — Encounter: Payer: Self-pay | Admitting: Dermatology

## 2024-01-04 VITALS — BP 114/72 | HR 85

## 2024-01-04 DIAGNOSIS — L810 Postinflammatory hyperpigmentation: Secondary | ICD-10-CM | POA: Diagnosis not present

## 2024-01-04 DIAGNOSIS — L700 Acne vulgaris: Secondary | ICD-10-CM | POA: Diagnosis not present

## 2024-01-04 DIAGNOSIS — L680 Hirsutism: Secondary | ICD-10-CM

## 2024-01-04 MED ORDER — SPIRONOLACTONE 100 MG PO TABS: 100.0000 mg | ORAL_TABLET | Freq: Every day | ORAL | 9 refills | Status: AC

## 2024-01-04 MED ORDER — TRETINOIN 0.025 % EX CREA: TOPICAL_CREAM | CUTANEOUS | 4 refills | Status: AC

## 2024-01-04 NOTE — Progress Notes (Unsigned)
   Follow-Up Visit   Subjective  Alison Lowery is a 48 y.o. female who presents for the following: Acne with secondary Hirsutism  Patient present today for follow up visit for Acne. Patient was last evaluated on 06/30/23. At this visit patient was prescribed Tretinoin  0.025% to apply on M,W and Fr& Spironolactone  100 mg. Patient reports sxs are improving. Patient denies medication changes.  The following portions of the chart were reviewed this encounter and updated as appropriate: medications, allergies, medical history  Review of Systems:  No other skin or systemic complaints except as noted in HPI or Assessment and Plan.  Objective  Well appearing patient in no apparent distress; mood and affect are within normal limits.  A focused examination was performed of the following areas: Face  Relevant exam findings are noted in the Assessment and Plan.           Assessment & Plan    Acne vulgaris and PIH Chronic acne with improved texture, persistent oiliness. Discontinued spironolactone . Using tretinoin . - Continue tretinoin  five nights weekly, Monday to Friday. - Introduce morning salicylic acid wash for oiliness. - Use Avene Cicalfate moisturizer with tretinoin  at night. - Apply light morning moisturizer post-wash. - Apply SPF 30 sunscreen daily. - Delay spironolactone  restart until post-wedding.  ACNE VULGARIS   Related Medications tretinoin  (RETIN-A ) 0.025 % cream Use pea size at night on MWF. spironolactone  (ALDACTONE ) 100 MG tablet Take 1 tablet (100 mg total) by mouth daily. HIRSUTISM   Related Medications spironolactone  (ALDACTONE ) 100 MG tablet Take 1 tablet (100 mg total) by mouth daily.  Return in about 6 months (around 07/03/2024) for Acne & Hirsutism F/U.  I, Jetta Ager, am acting as Neurosurgeon for [employer], DO.  Documentation: I have reviewed the above documentation for accuracy and completeness, and I agree with the above.  Delon Lenis,  DO

## 2024-01-04 NOTE — Patient Instructions (Addendum)
 VISIT SUMMARY:  You came in today for a follow-up appointment to manage your acne. You mentioned that while your skin texture has improved, you still experience greasiness. You are preparing for your wedding and are concerned about your skin's appearance. You are currently using tretinoin  three nights a week and are considering increasing it to five nights a week. You also use Differin moisturizer and SPF 30 sunscreen daily.  YOUR PLAN:  -ACNE VULGARIS: Acne vulgaris is a common skin condition that causes pimples and oily skin.  To help manage your acne, you should continue using tretinoin  five nights a week from Monday to Friday.  To address the oiliness, start using a LaRoche Posay salicylic acid wash in the morning.  At night, use Avene Cicalfate moisturizer with your tretinoin . In the morning, apply a light moisturizer after washing your face.  Continue using SPF 30 sunscreen daily. We will delay restarting spironolactone  until after your wedding.       Important Information   Due to recent changes in healthcare laws, you may see results of your pathology and/or laboratory studies on MyChart before the doctors have had a chance to review them. We understand that in some cases there may be results that are confusing or concerning to you. Please understand that not all results are received at the same time and often the doctors may need to interpret multiple results in order to provide you with the best plan of care or course of treatment. Therefore, we ask that you please give us  2 business days to thoroughly review all your results before contacting the office for clarification. Should we see a critical lab result, you will be contacted sooner.     If You Need Anything After Your Visit   If you have any questions or concerns for your doctor, please call our main line at [phone]. If no one answers, please leave a voicemail as directed and we will return your call as soon as possible.  Messages left after 4 pm will be answered the following business day.    You may also send us  a message via MyChart. We typically respond to MyChart messages within 1-2 business days.  For prescription refills, please ask your pharmacy to contact our office. Our fax number is [phone].  If you have an urgent issue when the clinic is closed that cannot wait until the next business day, you can page your doctor at the number below.     Please note that while we do our best to be available for urgent issues outside of office hours, we are not available 24/7.    If you have an urgent issue and are unable to reach us , you may choose to seek medical care at your doctor's office, retail clinic, urgent care center, or emergency room.   If you have a medical emergency, please immediately call 911 or go to the emergency department. In the event of inclement weather, please call our main line at [phone] for an update on the status of any delays or closures.  Dermatology Medication Tips: Please keep the boxes that topical medications come in in order to help keep track of the instructions about where and how to use these. Pharmacies typically print the medication instructions only on the boxes and not directly on the medication tubes.   If your medication is too expensive, please contact our office at [phone] or send us  a message through MyChart.    We are unable to tell what your  co-pay for medications will be in advance as this is different depending on your insurance coverage. However, we may be able to find a substitute medication at lower cost or fill out paperwork to get insurance to cover a needed medication.    If a prior authorization is required to get your medication covered by your insurance company, please allow us  1-2 business days to complete this process.   Drug prices often vary depending on where the prescription is filled and some pharmacies may offer cheaper prices.    The website www.goodrx.com contains coupons for medications through different pharmacies. The prices here do not account for what the cost may be with help from insurance (it may be cheaper with your insurance), but the website can give you the price if you did not use any insurance.  - You can print the associated coupon and take it with your prescription to the pharmacy.  - You may also stop by our office during regular business hours and pick up a GoodRx coupon card.  - If you need your prescription sent electronically to a different pharmacy, notify our office through [facility] or by phone at [phone]

## 2024-01-18 NOTE — Progress Notes (Unsigned)
 Office Visit Note  Patient: Alison Lowery             Date of Birth: 1976/03/16           MRN: [id number]             PCP: Lorren Greig PARAS, NP Referring: Lorren Greig PARAS, NP Visit Date: 01/20/2024 Occupation: Data Unavailable  Subjective:  Pain in both hips  History of Present Illness: Alison Lowery is a 48 y.o. female seropositive erosive rheumatoid arthritis.  She returns today after her last visit in October 2024.  She states earlier this week she woke up with left hip pain and gradually it moved to the other hip.  She has been having discomfort in both hips.  She points to the trochanteric region.  None of the other joints are painful.    Activities of Daily Living:  Patient reports morning stiffness for 15-20 minutes.   Patient Denies nocturnal pain.  Difficulty dressing/grooming: Denies Difficulty climbing stairs: Reports Difficulty getting out of chair: Reports Difficulty using hands for taps, buttons, cutlery, and/or writing: Denies  Review of Systems  Constitutional:  Negative for fatigue.  HENT:  Positive for mouth dryness. Negative for mouth sores.   Eyes:  Positive for dryness.  Respiratory:  Negative for shortness of breath.   Cardiovascular:  Negative for chest pain and palpitations.  Gastrointestinal:  Positive for diarrhea. Negative for blood in stool and constipation.  Endocrine: Positive for increased urination.  Genitourinary:  Negative for involuntary urination.  Musculoskeletal:  Positive for joint pain, gait problem, joint pain, joint swelling and morning stiffness. Negative for myalgias, muscle weakness, muscle tenderness and myalgias.  Skin:  Negative for color change, rash, hair loss and sensitivity to sunlight.  Allergic/Immunologic: Negative for susceptible to infections.  Neurological:  Negative for dizziness and headaches.  Hematological:  Negative for swollen glands.  Psychiatric/Behavioral:  Negative for depressed mood and sleep disturbance. The  patient is not nervous/anxious.     PMFS History:  Patient Active Problem List   Diagnosis Date Noted   Uterine leiomyoma 12/08/2021   Iron  deficiency anemia due to chronic blood loss 06/29/2021   Menorrhagia with regular cycle 06/29/2021   Cervical polyp 06/29/2021   History of ectopic pregnancy 06/29/2021   Intramural leiomyoma of uterus 06/29/2021   Rheumatoid arthritis involving multiple sites with positive rheumatoid factor, positive anti-CCP, +14 33 eta 12/03/2016   Elevated rheumatoid factor 11/18/2016   Tobacco abuse 11/18/2016   History of abnormal electrocardiogram 11/18/2016   History of diarrhea 11/18/2016   Breast mass, right 10/21/2010    Past Medical History:  Diagnosis Date   Ectopic pregnancy    Rheumatoid arthritis (HCC)     History reviewed. No pertinent family history. Past Surgical History:  Procedure Laterality Date   IR ANGIOGRAM PELVIS SELECTIVE OR SUPRASELECTIVE  12/08/2021   IR ANGIOGRAM PELVIS SELECTIVE OR SUPRASELECTIVE  12/08/2021   IR ANGIOGRAM SELECTIVE EACH ADDITIONAL VESSEL  12/08/2021   IR ANGIOGRAM SELECTIVE EACH ADDITIONAL VESSEL  12/08/2021   IR EMBO TUMOR ORGAN ISCHEMIA INFARCT INC GUIDE ROADMAPPING  12/08/2021   IR RADIOLOGIST EVAL & MGMT  09/01/2021   IR RADIOLOGIST EVAL & MGMT  01/02/2022   IR US  GUIDE VASC ACCESS RIGHT  12/08/2021   LAPAROSCOPIC UNILATERAL SALPINGECTOMY Left 2000   UTERINE FIBROID EMBOLIZATION  12/09/2021   Social History   Tobacco Use   Smoking status: Every Day    Current packs/day: 0.50    Average packs/day: 0.5 packs/day  for 15.0 years (7.5 ttl pk-yrs)    Types: Cigarettes    Passive exposure: Never   Smokeless tobacco: Never  Vaping Use   Vaping status: Never Used  Substance Use Topics   Alcohol use: Yes    Comment: occ   Drug use: No   Social History   Social History Narrative   Not on file     There is no immunization history for the selected administration types on file for this patient.    Objective: Vital Signs: BP 123/89   Pulse 76   Temp (!) 97.3 F (36.3 C)   Resp 16   Ht 5' 7 (1.702 m)   Wt 166 lb 3.2 oz (75.4 kg)   LMP 11/11/2021   BMI 26.03 kg/m    Physical Exam Vitals and nursing note reviewed.  Constitutional:      Appearance: She is well-developed.  HENT:     Head: Normocephalic and atraumatic.  Eyes:     Conjunctiva/sclera: Conjunctivae normal.  Cardiovascular:     Rate and Rhythm: Normal rate and regular rhythm.     Heart sounds: Normal heart sounds.  Pulmonary:     Effort: Pulmonary effort is normal.     Breath sounds: Normal breath sounds.  Abdominal:     General: Bowel sounds are normal.     Palpations: Abdomen is soft.  Musculoskeletal:     Cervical back: Normal range of motion.  Lymphadenopathy:     Cervical: No cervical adenopathy.  Skin:    General: Skin is warm and dry.     Capillary Refill: Capillary refill takes less than 2 seconds.  Neurological:     Mental Status: She is alert and oriented to person, place, and time.  Psychiatric:        Behavior: Behavior normal.      Musculoskeletal Exam: Cervical, thoracic and lumbar spine were in good range of motion.  There was no SI joint tenderness.  Shoulder joints, elbow joints, wrist joints, MCPs, PIPs and DIPs were in good range of motion with no synovitis.  Hip joints and knee joints were in good range of motion without any warmth swelling or effusion.  She had tenderness over bilateral trochanteric region.  There was no tenderness over ankles or MTPs.   CDAI Exam: CDAI Score: -- Patient Global: --; Provider Global: -- Swollen: --; Tender: -- Joint Exam 01/20/2024   No joint exam has been documented for this visit   There is currently no information documented on the homunculus. Go to the Rheumatology activity and complete the homunculus joint exam.  Investigation: No additional findings.  Imaging: No results found.  Recent Labs: Lab Results  Component Value Date    WBC 8.6 07/08/2022   HGB 13.7 07/08/2022   PLT 272 07/08/2022   NA 142 07/08/2022   K 3.7 07/08/2022   CL 106 07/08/2022   CO2 28 07/08/2022   GLUCOSE 82 07/08/2022   BUN 10 07/08/2022   CREATININE 0.82 07/08/2022   BILITOT 0.4 07/08/2022   ALKPHOS 43 02/19/2021   AST 12 07/08/2022   ALT 10 07/08/2022   PROT 7.2 07/08/2022   ALBUMIN 4.3 02/19/2021   CALCIUM 9.5 07/08/2022   GFRAA 127 09/24/2020   QFTBGOLD NEGATIVE 12/30/2016    Speciality Comments: PLQ Eye Exam: 09/16/2020 WNL @ [facility]  Appointment scheduled for next week.   Procedures:  Large Joint Inj: bilateral greater trochanter on 01/20/2024 2:22 PM Indications: pain Details: 27 G 1.5 in needle,  lateral approach  Arthrogram: No  Medications (Right): 1.5 mL lidocaine  1 %; 40 mg triamcinolone acetonide 40 MG/ML Aspirate (Right): 0 mL Medications (Left): 1.5 mL lidocaine  1 %; 40 mg triamcinolone acetonide 40 MG/ML Aspirate (Left): 0 mL Outcome: tolerated well, no immediate complications  Risk of infection, tendon injury, nerve injury, dermal atrophy and hypopigmentation were discussed. Procedure, treatment alternatives, risks and benefits explained, specific risks discussed. Consent was given by the patient. Immediately prior to procedure a time out was called to verify the correct patient, procedure, equipment, support staff and site/side marked as required. Patient was prepped and draped in the usual sterile fashion.     Allergies: Patient has no known allergies.   Assessment / Plan:     Visit Diagnoses: Rheumatoid arthritis involving multiple sites with positive rheumatoid factor (HCC) - Positive RF, positive anti-CCP, +14 3 3  ETA, right fifth MTP erosion: Patient reports pain and discomfort in bilateral hips which she points to the trochanteric region.  She has been taking hydroxychloroquine  200 mg twice daily Monday to Friday without any interruption.  She denies having a flare of rheumatoid  arthritis.  High risk medication use - Plaquenil  200 mg p.o. twice daily Monday to Friday.  She has not had an eye examination since June 2022.  Risk of ocular toxicity was discussed at length.  I advised that we would not fill Plaquenil  until further eye examination.  Information iMessage was placed in the AVS.  Will obtain labs today.  Trochanteric bursitis of both hips-she has been having pain and discomfort in bilateral trochanteric region.  I discussed the option of IT band stretches and physical therapy.  Patient states she has a wedding coming up next week.  She wanted immediate relief.  She requested bilateral cortisone injections.  After informed consent was obtained and side effects were discussed bilateral trochanteric region were injected with lidocaine  and Kenalog as described above.  She tolerated the procedure well.  Postprocedure instructions were given.  A handout on IT band stretches was given.  Stand-up desk was also advised for work.  Prescription was given.  Iron  deficiency anemia due to chronic blood loss  Acne vulgaris -she was prescribed spironolactone  and Retin-A  cream recently.  She has not picked up the prescription yet.  Followed by Dr. Alm  History of anemia  Tobacco abuse-smoking cessation was discussed.  Association of smoking with rheumatoid arthritis was discussed.  Orders: Orders Placed This Encounter  Procedures   Large Joint Inj   No orders of the defined types were placed in this encounter.   Follow-Up Instructions: Return in about 5 months (around 06/19/2024) for Rheumatoid arthritis.   Maya Nash, MD  Note - This record has been created using Animal nutritionist.  Chart creation errors have been sought, but may not always  have been located. Such creation errors do not reflect on  the standard of medical care.

## 2024-01-20 ENCOUNTER — Ambulatory Visit: Attending: Rheumatology | Admitting: Rheumatology

## 2024-01-20 ENCOUNTER — Encounter: Payer: Self-pay | Admitting: Rheumatology

## 2024-01-20 VITALS — BP 123/89 | HR 76 | Temp 97.3°F | Resp 16 | Ht 67.0 in | Wt 166.2 lb

## 2024-01-20 DIAGNOSIS — Z720 Tobacco use: Secondary | ICD-10-CM

## 2024-01-20 DIAGNOSIS — Z79899 Other long term (current) drug therapy: Secondary | ICD-10-CM

## 2024-01-20 DIAGNOSIS — E785 Hyperlipidemia, unspecified: Secondary | ICD-10-CM | POA: Diagnosis not present

## 2024-01-20 DIAGNOSIS — M7062 Trochanteric bursitis, left hip: Secondary | ICD-10-CM | POA: Diagnosis not present

## 2024-01-20 DIAGNOSIS — M0579 Rheumatoid arthritis with rheumatoid factor of multiple sites without organ or systems involvement: Secondary | ICD-10-CM

## 2024-01-20 DIAGNOSIS — Z862 Personal history of diseases of the blood and blood-forming organs and certain disorders involving the immune mechanism: Secondary | ICD-10-CM

## 2024-01-20 DIAGNOSIS — L700 Acne vulgaris: Secondary | ICD-10-CM

## 2024-01-20 DIAGNOSIS — D500 Iron deficiency anemia secondary to blood loss (chronic): Secondary | ICD-10-CM

## 2024-01-20 DIAGNOSIS — M7061 Trochanteric bursitis, right hip: Secondary | ICD-10-CM

## 2024-01-20 DIAGNOSIS — M25551 Pain in right hip: Secondary | ICD-10-CM

## 2024-01-20 LAB — CBC WITH DIFFERENTIAL/PLATELET
Absolute Lymphocytes: 1571 {cells}/uL (ref 850–3900)
Absolute Monocytes: 399 {cells}/uL (ref 200–950)
Basophils Absolute: 42 {cells}/uL (ref 0–200)
Basophils Relative: 1 %
Eosinophils Absolute: 80 {cells}/uL (ref 15–500)
Eosinophils Relative: 1.9 %
HCT: 43.4 % (ref 35.0–45.0)
Hemoglobin: 14.2 g/dL (ref 11.7–15.5)
MCH: 30.4 pg (ref 27.0–33.0)
MCHC: 32.7 g/dL (ref 32.0–36.0)
MCV: 92.9 fL (ref 80.0–100.0)
MPV: 9.9 fL (ref 7.5–12.5)
Monocytes Relative: 9.5 %
Neutro Abs: 2108 {cells}/uL (ref 1500–7800)
Neutrophils Relative %: 50.2 %
Platelets: 236 Thousand/uL (ref 140–400)
RBC: 4.67 Million/uL (ref 3.80–5.10)
RDW: 12 % (ref 11.0–15.0)
Total Lymphocyte: 37.4 %
WBC: 4.2 Thousand/uL (ref 3.8–10.8)

## 2024-01-20 LAB — COMPREHENSIVE METABOLIC PANEL WITH GFR
AG Ratio: 1.8 (calc) (ref 1.0–2.5)
ALT: 10 U/L (ref 6–29)
AST: 13 U/L (ref 10–35)
Albumin: 4.6 g/dL (ref 3.6–5.1)
Alkaline phosphatase (APISO): 52 U/L (ref 37–153)
BUN: 7 mg/dL (ref 7–25)
CO2: 29 mmol/L (ref 20–32)
Calcium: 10 mg/dL (ref 8.6–10.4)
Chloride: 107 mmol/L (ref 98–110)
Creat: 0.82 mg/dL (ref 0.50–1.03)
Globulin: 2.6 g/dL (ref 1.9–3.7)
Glucose, Bld: 77 mg/dL (ref 65–99)
Potassium: 4.1 mmol/L (ref 3.5–5.3)
Sodium: 142 mmol/L (ref 135–146)
Total Bilirubin: 0.4 mg/dL (ref 0.2–1.2)
Total Protein: 7.2 g/dL (ref 6.1–8.1)
eGFR: 87 mL/min/1.73m2 (ref >=60)

## 2024-01-20 LAB — LIPID PANEL
Cholesterol: 152 mg/dL (ref <200)
HDL: 53 mg/dL (ref >=50)
LDL Cholesterol (Calc): 81 mg/dL
Non-HDL Cholesterol (Calc): 99 mg/dL (ref <130)
Total CHOL/HDL Ratio: 2.9 (calc) (ref <5.0)
Triglycerides: 95 mg/dL (ref <150)

## 2024-01-20 MED ORDER — TRIAMCINOLONE ACETONIDE 40 MG/ML IJ SUSP
40.0000 mg | INTRAMUSCULAR | Status: AC | PRN
  Administered 2024-01-20: 40 mg via INTRA_ARTICULAR

## 2024-01-20 MED ORDER — LIDOCAINE HCL 1 % IJ SOLN
1.5000 mL | INTRAMUSCULAR | Status: AC | PRN
  Administered 2024-01-20: 1.5 mL

## 2024-01-20 NOTE — Patient Instructions (Addendum)
 Vaccines You are taking a medication(s) that can suppress your immune system.  The following immunizations are recommended: Flu annually Covid-19  RSV Td/Tdap (tetanus, diphtheria, pertussis) every 10 years Pneumonia (Prevnar 15 then Pneumovax 23 at least 1 year apart.  Alternatively, can take Prevnar 20 without needing additional dose) Shingrix: 2 doses from 4 weeks to 6 months apart  Please check with your PCP to make sure you are up to date.    Iliotibial Band Syndrome Rehab Ask your health care provider which exercises are safe for you. Do exercises exactly as told by your provider and adjust them as told. It's normal to feel mild stretching, pulling, tightness, or discomfort as you do these exercises. Stop right away if you feel sudden pain or your pain gets a lot worse. Do not begin these exercises until told by your provider. Stretching and range-of-motion exercises These exercises warm up your muscles and joints. They also improve the movement and flexibility of your hip and pelvis. Quadriceps stretch, prone  Lie face down (prone) on a firm surface like a bed or padded floor. Bend your left / right knee. Reach back to hold your ankle or pant leg. If you can't reach your ankle or pant leg, use a belt looped around your foot and grab the belt instead. Gently pull your heel toward your butt. Your knee should not slide out to the side. You should feel a stretch in the front of your thigh and knee, also called the quadriceps. Hold this position for __________ seconds. Repeat __________ times. Complete this exercise __________ times a day. Iliotibial band stretch The iliotibial band is a strip of tissue that runs along the outside of your hip down to your knee. Lie on your side with your left / right leg on top. Bend both knees and grab your left / right ankle. Stretch out your bottom arm to help you balance. Slowly bring your top knee back so your thigh goes behind your back. Slowly  lower your top leg toward the floor until you feel a gentle stretch on the outside of your left / right hip and thigh. If you don't feel a stretch and your knee won't go farther, place the heel of your other foot on top of your knee and pull your knee down toward the floor with your foot. Hold this position for __________ seconds. Repeat __________ times. Complete this exercise __________ times a day. Strengthening exercises These exercises build strength and endurance in your hip and pelvis. Endurance means your muscles can keep working even when they're tired. Straight leg raises, side-lying This exercise strengthens the muscles that rotate the leg at the hip and move it away from your body. These muscles are called hip abductors. Lie on your side with your left / right leg on top. Lie so your head, shoulder, hip, and knee line up. You can bend your bottom knee to help you balance. Roll your hips slightly forward so they're stacked directly over each other. Your left / right knee should face forward. Tense the muscles in your outer thigh and hip. Lift your top leg 4-6 inches (10-15 cm) off the ground. Hold this position for __________ seconds. Slowly lower your leg back down to the starting position. Let your muscles fully relax before doing this exercise again. Repeat __________ times. Complete this exercise __________ times a day. Leg raises, prone This exercise strengthens the muscles that move the hips backward. These muscles are called hip extensors. Lie face down (  prone) on your bed or a firm surface. You can put a pillow under your hips for comfort and to support your lower back. Bend your left / right knee so your foot points straight up toward the ceiling. Keep the other leg straight and behind you. Squeeze your butt muscles. Lift your left / right thigh off the firm surface. Do not let your back arch. Tense your thigh muscle as hard as you can without having more knee pain. Hold this  position for __________ seconds. Slowly lower your leg to the starting position. Allow your leg to relax all the way. Repeat __________ times. Complete this exercise __________ times a day. Hip hike  Stand sideways on a bottom step. Place your feet so that your left / right leg is on the step, and the other foot is hanging off the side. If you need support for balance, hold onto a railing or wall. Keep your knees straight and your abdomen square, meaning your hips are level. Then, lift your left / right hip up toward the ceiling. Slowly let your leg that's hanging off the step lower towards the floor. Your foot should get closer to the ground. Do not lean or bend your knees during this movement. Repeat __________ times. Complete this exercise __________ times a day. This information is not intended to replace advice given to you by your health care provider. Make sure you discuss any questions you have with your health care provider. Document Revised: 06/12/2022 Document Reviewed: 06/12/2022 Elsevier Patient Education  2024 [employer]  Managing the Challenge of Quitting Smoking Quitting smoking is a physical and mental challenge. You may have cravings, withdrawal symptoms, and temptation to smoke. Before quitting, work with your health care provider to make a plan that can help you manage quitting. Making a plan before you quit may keep you from smoking when you have the urge to smoke while trying to quit. How to manage lifestyle changes Managing stress Stress can make you want to smoke, and wanting to smoke may cause stress. It is important to find ways to manage your stress. You could try some of the following: Practice relaxation techniques. Breathe slowly and deeply, in through your nose and out through your mouth. Listen to music. Soak in a bath or take a shower. Imagine a peaceful place or vacation. Get some support. Talk with family or friends about your stress. Join a support  group. Talk with a counselor or therapist. Get some physical activity. Go for a walk, run, or bike ride. Play a favorite sport. Practice yoga.  Medicines Talk with your health care provider about medicines that might help you deal with cravings and make quitting easier for you. Relationships Social situations can be difficult when you are quitting smoking. To manage this, you can: Avoid parties and other social situations where people might be smoking. Avoid alcohol. Leave right away if you have the urge to smoke. Explain to your family and friends that you are quitting smoking. Ask for support and let them know you might be a bit grumpy. Plan activities where smoking is not an option. General instructions Be aware that many people gain weight after they quit smoking. However, not everyone does. To keep from gaining weight, have a plan in place before you quit, and stick to the plan after you quit. Your plan should include: Eating healthy snacks. When you have a craving, it may help to: Eat popcorn, or try carrots, celery, or other cut vegetables. Chew  sugar-free gum. Changing how you eat. Eat small portion sizes at meals. Eat 4-6 small meals throughout the day instead of 1-2 large meals a day. Be mindful when you eat. You should avoid watching television or doing other things that might distract you as you eat. Exercising regularly. Make time to exercise each day. If you do not have time for a long workout, do short bouts of exercise for 5-10 minutes several times a day. Do some form of strengthening exercise, such as weight lifting. Do some exercise that gets your heart beating and causes you to breathe deeply, such as walking fast, running, swimming, or biking. This is very important. Drinking plenty of water or other low-calorie or no-calorie drinks. Drink enough fluid to keep your urine pale yellow.  How to recognize withdrawal symptoms Your body and mind may experience  discomfort as you try to get used to not having nicotine  in your system. These effects are called withdrawal symptoms. They may include: Feeling hungrier than normal. Having trouble concentrating. Feeling irritable or restless. Having trouble sleeping. Feeling depressed. Craving a cigarette. These symptoms may surprise you, but they are normal to have when quitting smoking. To manage withdrawal symptoms: Avoid places, people, and activities that trigger your cravings. Remember why you want to quit. Get plenty of sleep. Avoid coffee and other drinks that contain caffeine. These may worsen some of your symptoms. How to manage cravings Come up with a plan for how to deal with your cravings. The plan should include the following: A definition of the specific situation you want to deal with. An activity or action you will take to replace smoking. A clear idea for how this action will help. The name of someone who could help you with this. Cravings usually last for 5-10 minutes. Consider taking the following actions to help you with your plan to deal with cravings: Keep your mouth busy. Chew sugar-free gum. Suck on hard candies or a straw. Brush your teeth. Keep your hands and body busy. Change to a different activity right away. Squeeze or play with a ball. Do an activity or a hobby, such as making bead jewelry, practicing needlepoint, or working with wood. Mix up your normal routine. Take a short exercise break. Go for a quick walk, or run up and down stairs. Focus on doing something kind or helpful for someone else. Call a friend or family member to talk during a craving. Join a support group. Contact a quitline. Where to find support To get help or find a support group: Call the National Cancer Institute's Smoking Quitline: 1-800-QUIT-NOW 779-515-0304) Text QUIT to SmokefreeTXT: 521151 Where to find more information Visit these websites to find more information on quitting  smoking: U.[place]. Department of Health and Human Services: www.smokefree.gov American Lung Association: www.freedomfromsmoking.org Centers for Disease Control and Prevention (CDC): FootballExhibition.com.br American Heart Association: www.heart.org Contact a health care provider if: You want to change your plan for quitting. The medicines you are taking are not helping. Your eating feels out of control or you cannot sleep. You feel depressed or become very anxious. Summary Quitting smoking is a physical and mental challenge. You will face cravings, withdrawal symptoms, and temptation to smoke again. Preparation can help you as you go through these challenges. Try different techniques to manage stress, handle social situations, and prevent weight gain. You can deal with cravings by keeping your mouth busy (such as by chewing gum), keeping your hands and body busy, calling family or friends, or contacting a  quitline for people who want to quit smoking. You can deal with withdrawal symptoms by avoiding places where people smoke, getting plenty of rest, and avoiding drinks that contain caffeine. This information is not intended to replace advice given to you by your health care provider. Make sure you discuss any questions you have with your health care provider. Document Revised: 03/21/2021 Document Reviewed: 03/21/2021 Elsevier Patient Education  2024 [employer].

## 2024-01-21 ENCOUNTER — Ambulatory Visit: Payer: Self-pay | Admitting: Rheumatology

## 2024-01-21 NOTE — Progress Notes (Signed)
 CBC and CMP are normal, lipid panel is within normal limits.  Please forward results to her PCP.

## 2024-02-18 ENCOUNTER — Telehealth: Payer: Self-pay | Admitting: Rheumatology

## 2024-02-18 NOTE — Telephone Encounter (Signed)
 Patient called requesting to speak with Alison Lowery directly regarding her FMLA paperwork.

## 2024-02-18 NOTE — Telephone Encounter (Signed)
 Returned call to patient. 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 Patient states she is unable to find the prescription we gave her at her last appointment for [facility]. Patient is requesting a new prescription. Patient will upload her paperwork and send it through my chart.

## 2024-06-27 ENCOUNTER — Ambulatory Visit: Admitting: Rheumatology

## 2024-07-05 ENCOUNTER — Ambulatory Visit: Admitting: Dermatology
